# Patient Record
Sex: Female | Born: 1970 | Race: Black or African American | Hispanic: No | Smoking: Never smoker
Health system: Southern US, Community
[De-identification: ages and names within clinical notes are randomized; demographics above are authoritative.]

## PROBLEM LIST (undated history)

## (undated) DIAGNOSIS — Z5189 Encounter for other specified aftercare: Secondary | ICD-10-CM

## (undated) DIAGNOSIS — A6000 Herpesviral infection of urogenital system, unspecified: Secondary | ICD-10-CM

## (undated) DIAGNOSIS — D219 Benign neoplasm of connective and other soft tissue, unspecified: Secondary | ICD-10-CM

## (undated) HISTORY — DX: Benign neoplasm of connective and other soft tissue, unspecified: D21.9

## (undated) HISTORY — PX: MYOMECTOMY: SHX85

## (undated) HISTORY — DX: Encounter for other specified aftercare: Z51.89

## (undated) HISTORY — PX: WISDOM TOOTH EXTRACTION: SHX21

## (undated) HISTORY — DX: Herpesviral infection of urogenital system, unspecified: A60.00

---

## 1999-09-16 ENCOUNTER — Other Ambulatory Visit: Admission: RE | Admit: 1999-09-16 | Discharge: 1999-09-16 | Payer: Self-pay | Admitting: Obstetrics & Gynecology

## 2017-02-09 ENCOUNTER — Other Ambulatory Visit: Payer: Self-pay

## 2017-02-09 ENCOUNTER — Ambulatory Visit: Payer: Self-pay | Admitting: Obstetrics and Gynecology

## 2017-02-09 ENCOUNTER — Encounter: Payer: Self-pay | Admitting: Obstetrics and Gynecology

## 2017-02-09 ENCOUNTER — Ambulatory Visit (INDEPENDENT_AMBULATORY_CARE_PROVIDER_SITE_OTHER): Payer: 59 | Admitting: Obstetrics and Gynecology

## 2017-02-09 VITALS — BP 138/76 | HR 76 | Ht 65.0 in | Wt 164.0 lb

## 2017-02-09 DIAGNOSIS — Z1151 Encounter for screening for human papillomavirus (HPV): Secondary | ICD-10-CM | POA: Diagnosis not present

## 2017-02-09 DIAGNOSIS — Z1239 Encounter for other screening for malignant neoplasm of breast: Secondary | ICD-10-CM

## 2017-02-09 DIAGNOSIS — Z1231 Encounter for screening mammogram for malignant neoplasm of breast: Secondary | ICD-10-CM | POA: Diagnosis not present

## 2017-02-09 DIAGNOSIS — Z13220 Encounter for screening for lipoid disorders: Secondary | ICD-10-CM

## 2017-02-09 DIAGNOSIS — Z01419 Encounter for gynecological examination (general) (routine) without abnormal findings: Secondary | ICD-10-CM

## 2017-02-09 DIAGNOSIS — Z0000 Encounter for general adult medical examination without abnormal findings: Secondary | ICD-10-CM | POA: Diagnosis not present

## 2017-02-09 DIAGNOSIS — Z3169 Encounter for other general counseling and advice on procreation: Secondary | ICD-10-CM | POA: Diagnosis not present

## 2017-02-09 DIAGNOSIS — Z124 Encounter for screening for malignant neoplasm of cervix: Secondary | ICD-10-CM | POA: Diagnosis not present

## 2017-02-09 NOTE — Progress Notes (Signed)
PCP:  Patient, No Pcp Per   Chief Complaint  Patient presents with  . Gynecologic Exam    No complaints     HPI:      [name] is a 46 y.o. No obstetric history on file. who LMP was Patient's last menstrual period was 02/07/2017., presents today for her annual examination.  Her menses are regular every 28-30 days, lasting 4 days.  Dysmenorrhea none. She does not have intermenstrual bleeding. She is s/p 2 myomectomies due to large number of leio. She wanted to preserve fertility at the time and still wonders about pregnancy.   Sex activity: single partner, contraception - none. Conception ok.  Last Pap: elsewhere. No hx of abn paps in past.  Hx of STDs: HSV type 2. No recent outbreaks.  Last mammogram: not recently. There is no FH of breast cancer. There is no FH of ovarian cancer. The patient does not do self-breast exams.  Tobacco use: The patient denies current or previous tobacco use. Alcohol use: social drinker No drug use.  Exercise: moderately active  She does get adequate calcium but not Vitamin D in her diet.  Not had recent labs.  Past Medical History:  Diagnosis Date  . Fibroids     Past Surgical History:  Procedure Laterality Date  . MYOMECTOMY  04/2003:06/2014  . WISDOM TOOTH EXTRACTION  73's    Family History  Problem Relation Age of Onset  . Hypertension Mother   . Diabetes type II Mother   . Hypertension Father   . Hypertension Maternal Grandmother   . Diabetes Mellitus II Maternal Grandmother   . Hypertension Paternal Grandmother   . Stroke Paternal Grandmother   . COPD Paternal Grandfather     Social History   Socioeconomic History  . Marital status: Married    Spouse name: Not on file  . Number of children: Not on file  . Years of education: Not on file  . Highest education level: Not on file  Social Needs  . Financial resource strain: Not on file  . Food insecurity - worry: Not on file  . Food insecurity - inability:  Not on file  . Transportation needs - medical: Not on file  . Transportation needs - non-medical: Not on file  Occupational History  . Occupation: Therapist, art  Tobacco Use  . Smoking status: Never Smoker  . Smokeless tobacco: Never Used  Substance and Sexual Activity  . Alcohol use: Yes    Alcohol/week: 0.6 oz    Types: 1 Glasses of wine per week  . Drug use: No  . Sexual activity: Yes    Partners: Male    Birth control/protection: None  Other Topics Concern  . Not on file  Social History Narrative  . Not on file    No outpatient medications have been marked as taking for the 02/09/17 encounter (Office Visit) with Copland, Deirdre Evener, PA-C.     ROS:  Review of Systems  Constitutional: Negative for fatigue, fever and unexpected weight change.  Respiratory: Negative for cough, shortness of breath and wheezing.   Cardiovascular: Negative for chest pain, palpitations and leg swelling.  Gastrointestinal: Negative for blood in stool, constipation, diarrhea, nausea and vomiting.  Endocrine: Negative for cold intolerance, heat intolerance and polyuria.  Genitourinary: Negative for dyspareunia, dysuria, flank pain, frequency, genital sores, hematuria, menstrual problem, pelvic pain, urgency, vaginal bleeding, vaginal discharge and vaginal pain.  Musculoskeletal: Negative for back pain, joint swelling and myalgias.  Skin: Negative  for rash.  Neurological: Negative for dizziness, syncope, light-headedness, numbness and headaches.  Hematological: Negative for adenopathy.  Psychiatric/Behavioral: Negative for agitation, confusion, sleep disturbance and suicidal ideas. The patient is not nervous/anxious.      Objective: BP 138/76 (BP Location: Left Arm, Patient Position: Sitting, Cuff Size: Normal)   Pulse 76   Ht '5\' 5"'  (1.651 m)   Wt 164 lb (74.4 kg)   LMP 02/07/2017   BMI 27.29 kg/m    Physical Exam  Constitutional: She is oriented to person, place, and time. She appears  well-developed and well-nourished.  Genitourinary: Vagina normal and uterus normal. There is no rash or tenderness on the right labia. There is no rash or tenderness on the left labia. No erythema or tenderness in the vagina. No vaginal discharge found. Right adnexum does not display mass and does not display tenderness. Left adnexum does not display mass and does not display tenderness. Cervix does not exhibit motion tenderness or polyp. Uterus is not enlarged or tender.  Neck: Normal range of motion. No thyromegaly present.  Cardiovascular: Normal rate, regular rhythm and normal heart sounds.  No murmur heard. Pulmonary/Chest: Effort normal and breath sounds normal. Right breast exhibits no mass, no nipple discharge, no skin change and no tenderness. Left breast exhibits no mass, no nipple discharge, no skin change and no tenderness.  Abdominal: Soft. There is no tenderness. There is no guarding.  Musculoskeletal: Normal range of motion.  Neurological: She is alert and oriented to person, place, and time. No cranial nerve deficit.  Psychiatric: She has a normal mood and affect. Her behavior is normal.  Vitals reviewed.   Assessment/Plan: Encounter for annual routine gynecological examination  Cervical cancer screening - Plan: IGP, Aptima HPV  Screening for HPV (human papillomavirus) - Plan: IGP, Aptima HPV  Screening for breast cancer - Pt to sched mammo. - Plan: MM DIGITAL SCREENING BILATERAL  Infertility counseling - Question ability to conceive d/t age. Pt to try urine ovulation pred kit. Could also do [facility]H labs/semen analysis. Pt to consider. Also question status of uterus  Blood tests for routine general physical examination - Plan: Comprehensive metabolic panel, Lipid panel  Screening cholesterol level - Plan: Lipid panel           GYN counsel breast self exam, mammography screening, adequate intake of calcium and vitamin D, diet and exercise     F/U  Return in about 1 year  (around 02/09/2018).  Alicia B. Copland, PA-C 02/09/2017 12:06 PM

## 2017-02-09 NOTE — Patient Instructions (Signed)
I value your feedback and entrusting us with your care. If you get a [facility] patient survey, I would appreciate you taking the time to let us know about your experience today. Thank you! 

## 2017-02-11 LAB — IGP, APTIMA HPV
HPV APTIMA: NEGATIVE
PAP SMEAR COMMENT: 0

## 2017-02-17 NOTE — Addendum Note (Signed)
Addended by: Leonor Liv R on: 02/17/2017 12:05 PM   Modules accepted: Orders

## 2017-02-19 ENCOUNTER — Telehealth: Payer: Self-pay | Admitting: Obstetrics and Gynecology

## 2017-02-19 DIAGNOSIS — R7309 Other abnormal glucose: Secondary | ICD-10-CM

## 2017-02-19 LAB — COMPREHENSIVE METABOLIC PANEL
ALBUMIN: 4.5 g/dL (ref 3.5–5.5)
ALT: 26 IU/L (ref 0–32)
AST: 33 IU/L (ref 0–40)
Albumin/Globulin Ratio: 2.3 — ABNORMAL HIGH (ref 1.2–2.2)
Alkaline Phosphatase: 49 IU/L (ref 39–117)
BUN/Creatinine Ratio: 13 (ref 9–23)
BUN: 11 mg/dL (ref 6–24)
Bilirubin Total: 0.3 mg/dL (ref 0.0–1.2)
CALCIUM: 9.7 mg/dL (ref 8.7–10.2)
CO2: 23 mmol/L (ref 20–29)
CREATININE: 0.85 mg/dL (ref 0.57–1.00)
Chloride: 101 mmol/L (ref 96–106)
GFR calc Af Amer: 95 mL/min/{1.73_m2} (ref >59)
GFR, EST NON AFRICAN AMERICAN: 82 mL/min/{1.73_m2} (ref >59)
GLOBULIN, TOTAL: 2 g/dL (ref 1.5–4.5)
Glucose: 125 mg/dL — ABNORMAL HIGH (ref 65–99)
Potassium: 4.8 mmol/L (ref 3.5–5.2)
SODIUM: 140 mmol/L (ref 134–144)
Total Protein: 6.5 g/dL (ref 6.0–8.5)

## 2017-02-19 LAB — LIPID PANEL
CHOL/HDL RATIO: 3.6 ratio (ref 0.0–4.4)
Cholesterol, Total: 159 mg/dL (ref 100–199)
HDL: 44 mg/dL (ref >39)
LDL CALC: 87 mg/dL (ref 0–99)
TRIGLYCERIDES: 140 mg/dL (ref 0–149)
VLDL Cholesterol Cal: 28 mg/dL (ref 5–40)

## 2017-02-19 NOTE — Telephone Encounter (Signed)
Pt aware of labs. Need HgA1C. Will f/u with results.

## 2017-02-27 ENCOUNTER — Other Ambulatory Visit: Payer: Self-pay | Admitting: Obstetrics and Gynecology

## 2017-02-28 LAB — HEMOGLOBIN A1C
ESTIMATED AVERAGE GLUCOSE: 131 mg/dL
HEMOGLOBIN A1C: 6.2 % — AB (ref 4.8–5.6)

## 2017-03-03 ENCOUNTER — Telehealth: Payer: Self-pay | Admitting: Obstetrics and Gynecology

## 2017-03-03 DIAGNOSIS — R7303 Prediabetes: Secondary | ICD-10-CM | POA: Insufficient documentation

## 2017-03-03 NOTE — Telephone Encounter (Signed)
Pt aware of pre-DM on labs with HgA1C of 6.2%. Pt started exercise/diet changes. Repeat labs in 6 months. Will f/u with results.

## 2017-07-02 ENCOUNTER — Ambulatory Visit
Admission: RE | Admit: 2017-07-02 | Discharge: 2017-07-02 | Disposition: A | Discharge status: Home / Self Care | Payer: 59 | Source: Ambulatory Visit | Attending: Obstetrics and Gynecology | Admitting: Obstetrics and Gynecology

## 2017-07-02 DIAGNOSIS — Z1231 Encounter for screening mammogram for malignant neoplasm of breast: Secondary | ICD-10-CM | POA: Insufficient documentation

## 2017-07-02 DIAGNOSIS — N6489 Other specified disorders of breast: Secondary | ICD-10-CM | POA: Diagnosis not present

## 2017-07-02 DIAGNOSIS — R928 Other abnormal and inconclusive findings on diagnostic imaging of breast: Secondary | ICD-10-CM | POA: Diagnosis not present

## 2017-07-02 DIAGNOSIS — Z1239 Encounter for other screening for malignant neoplasm of breast: Secondary | ICD-10-CM

## 2017-07-27 ENCOUNTER — Other Ambulatory Visit: Payer: Self-pay | Admitting: Obstetrics and Gynecology

## 2017-07-27 DIAGNOSIS — R928 Other abnormal and inconclusive findings on diagnostic imaging of breast: Secondary | ICD-10-CM

## 2017-07-27 DIAGNOSIS — N6489 Other specified disorders of breast: Secondary | ICD-10-CM

## 2017-08-07 ENCOUNTER — Ambulatory Visit
Admission: RE | Admit: 2017-08-07 | Discharge: 2017-08-07 | Disposition: A | Discharge status: Home / Self Care | Payer: 59 | Source: Ambulatory Visit | Attending: Obstetrics and Gynecology | Admitting: Obstetrics and Gynecology

## 2017-08-07 DIAGNOSIS — N6489 Other specified disorders of breast: Secondary | ICD-10-CM

## 2017-08-07 DIAGNOSIS — R928 Other abnormal and inconclusive findings on diagnostic imaging of breast: Secondary | ICD-10-CM

## 2017-08-09 ENCOUNTER — Encounter: Payer: Self-pay | Admitting: Obstetrics and Gynecology

## 2018-02-22 ENCOUNTER — Ambulatory Visit: Payer: 59 | Admitting: Obstetrics and Gynecology

## 2018-04-28 ENCOUNTER — Encounter: Payer: Self-pay | Admitting: Family Medicine

## 2018-04-28 ENCOUNTER — Other Ambulatory Visit (HOSPITAL_COMMUNITY)
Admission: RE | Admit: 2018-04-28 | Discharge: 2018-04-28 | Disposition: A | Discharge status: Home / Self Care | Payer: BLUE CROSS/BLUE SHIELD | Source: Ambulatory Visit | Attending: Family Medicine | Admitting: Family Medicine

## 2018-04-28 ENCOUNTER — Ambulatory Visit: Payer: BLUE CROSS/BLUE SHIELD | Admitting: Family Medicine

## 2018-04-28 VITALS — BP 130/78 | HR 75 | Temp 98.9°F | Resp 16 | Ht 64.0 in | Wt 147.1 lb

## 2018-04-28 DIAGNOSIS — Z113 Encounter for screening for infections with a predominantly sexual mode of transmission: Secondary | ICD-10-CM | POA: Insufficient documentation

## 2018-04-28 DIAGNOSIS — Z130 Encounter for screening for diseases of the blood and blood-forming organs and certain disorders involving the immune mechanism: Secondary | ICD-10-CM

## 2018-04-28 DIAGNOSIS — R739 Hyperglycemia, unspecified: Secondary | ICD-10-CM | POA: Diagnosis not present

## 2018-04-28 DIAGNOSIS — Z13220 Encounter for screening for lipoid disorders: Secondary | ICD-10-CM | POA: Diagnosis not present

## 2018-04-28 DIAGNOSIS — Z23 Encounter for immunization: Secondary | ICD-10-CM | POA: Diagnosis not present

## 2018-04-28 DIAGNOSIS — Z86018 Personal history of other benign neoplasm: Secondary | ICD-10-CM

## 2018-04-28 DIAGNOSIS — A6000 Herpesviral infection of urogenital system, unspecified: Secondary | ICD-10-CM | POA: Insufficient documentation

## 2018-04-28 DIAGNOSIS — A6004 Herpesviral vulvovaginitis: Secondary | ICD-10-CM

## 2018-04-28 DIAGNOSIS — E559 Vitamin D deficiency, unspecified: Secondary | ICD-10-CM

## 2018-04-28 MED ORDER — VALACYCLOVIR HCL 500 MG PO TABS: 500.0000 mg | ORAL_TABLET | Freq: Two times a day (BID) | ORAL | 3 refills | Status: DC

## 2018-04-28 NOTE — Progress Notes (Signed)
Name: Alexis Golden   MRN: [id number]    DOB: 1970/11/05   Date:04/28/2018       Progress Note  Subjective  Chief Complaint  Chief Complaint  Patient presents with  . Establish Care    HPI  Genital herpes: she states no outbreaks in the past 10 years. She has been divorced since 03/2018, no sexual partners since separation. Discussed prevention when she starts to date again.   Pre-diabetes: last lab work done at [facility]: hgbA1C was 6.2 % and she changed her diet and joined a gym, she was working out 4 times a week. She just now moved to [place] and needs to find another gym there. She has lost almost 20 lbs in the past 13 months. We will recheck labs.   History of uterine fibroids: had surgery in the past .    Patient Active Problem List   Diagnosis Date Noted  . History of uterine fibroid 04/28/2018  . Genital herpes 04/28/2018  . Pre-diabetes 03/03/2017    Past Surgical History:  Procedure Laterality Date  . MYOMECTOMY  04/2003:06/2012  . WISDOM TOOTH EXTRACTION  77's    Family History  Problem Relation Age of Onset  . Hypertension Mother   . Diabetes type II Mother   . Hypertension Father   . Hypertension Maternal Grandmother   . Diabetes Mellitus II Maternal Grandmother   . Hypertension Paternal Grandmother   . Stroke Paternal Grandmother   . COPD Paternal Grandfather   . Hypertension Maternal Aunt   . COPD Paternal 81   . Breast cancer Neg Hx     Social History   Socioeconomic History  . Marital status: Divorced    Spouse name: Not on file  . Number of children: 0  . Years of education: Not on file  . Highest education level: Bachelor's degree (e.g., BA, AB, BS)  Occupational History  . Occupation: Therapist, art  Social Needs  . Financial resource strain: Not hard at all  . Food insecurity:    Worry: Never true    Inability: Never true  . Transportation needs:    Medical: No    Non-medical: No  Tobacco Use  . Smoking status: Never  Smoker  . Smokeless tobacco: Never Used  Substance and Sexual Activity  . Alcohol use: Yes    Alcohol/week: 1.0 standard drinks    Types: 1 Glasses of wine per week  . Drug use: No  . Sexual activity: Not Currently    Partners: Male    Birth control/protection: None  Lifestyle  . Physical activity:    Days per week: 0 days    Minutes per session: 0 min  . Stress: Not at all  Relationships  . Social connections:    Talks on phone: More than three times a week    Gets together: More than three times a week    Attends religious service: Never    Active member of club or organization: No    Attends meetings of clubs or organizations: Never    Relationship status: Divorced  . Intimate partner violence:    Fear of current or ex partner: No    Emotionally abused: No    Physically abused: No    Forced sexual activity: No  Other Topics Concern  . Not on file  Social History Narrative   She was married fir 6 years,  divorced since 03/2017    Tried to have children but unable.  Current Outpatient Medications:  Marland Kitchen  Multiple Vitamins-Minerals (HAIR SKIN AND NAILS FORMULA PO), Take 1 tablet by mouth daily., Disp: , Rfl:  .  valACYclovir (VALTREX) 500 MG tablet, Take 1 tablet (500 mg total) by mouth 2 (two) times daily., Disp: 90 tablet, Rfl: 3  Allergies  Allergen Reactions  . Influenza Vaccines Rash  . Percocet [Oxycodone-Acetaminophen] Other (See Comments)    Sensitivity    I personally reviewed active problem list, medication list, allergies, family history, social history with the patient/caregiver today.   ROS  Constitutional: Negative for fever, positive for  weight change - loss but because with life style modification .  Respiratory: Negative for cough and shortness of breath.   Cardiovascular: Negative for chest pain or palpitations.  Gastrointestinal: Negative for abdominal pain, no bowel changes.  Musculoskeletal: Negative for gait problem or joint swelling.   Skin: Negative for rash.  Neurological: Negative for dizziness or headache.  No other specific complaints in a complete review of systems (except as listed in HPI above).  Objective  Vitals:   04/28/18 1405  BP: 130/78  Pulse: 75  Resp: 16  Temp: 98.9 F (37.2 C)  TempSrc: Oral  SpO2: 98%  Weight: 147 lb 1.6 oz (66.7 kg)  Height: 5\' 4"  (1.626 m)    Body mass index is 25.25 kg/m.  Physical Exam  Constitutional: Patient appears well-developed and well-nourished. No distress.  HEENT: head atraumatic, normocephalic, pupils equal and reactive to light, neck supple, throat within normal limits Cardiovascular: Normal rate, regular rhythm and normal heart sounds.  No murmur heard. No BLE edema. Pulmonary/Chest: Effort normal and breath sounds normal. No respiratory distress. Abdominal: Soft.  There is no tenderness. Psychiatric: Patient has a normal mood and affect. behavior is normal. Judgment and thought content normal.  PHQ2/9: Depression screen PHQ 2/9 04/28/2018  Decreased Interest 0  Down, Depressed, Hopeless 0  PHQ - 2 Score 0  Altered sleeping 0  Tired, decreased energy 0  Change in appetite 0  Feeling bad or failure about yourself  0  Trouble concentrating 0  Moving slowly or fidgety/restless 0  Suicidal thoughts 0  PHQ-9 Score 0  Difficult doing work/chores Not difficult at all     Fall Risk: Fall Risk  04/28/2018  Falls in the past year? 0     Functional Status Survey: Is the patient deaf or have difficulty hearing?: No Does the patient have difficulty seeing, even when wearing glasses/contacts?: No Does the patient have difficulty concentrating, remembering, or making decisions?: No Does the patient have difficulty walking or climbing stairs?: No Does the patient have difficulty dressing or bathing?: No Does the patient have difficulty doing errands alone such as visiting a doctor's office or shopping?: No    Assessment & Plan  1. History of uterine  fibroid  - CBC with Differential/Platelet  2. Lipid screening  - Lipid panel  3. Hyperglycemia  - Hemoglobin A1c - COMPLETE METABOLIC PANEL WITH GFR  4. Herpes simplex vulvovaginitis  - valACYclovir (VALTREX) 500 MG tablet; Take 1 tablet (500 mg total) by mouth 2 (two) times daily.  Dispense: 90 tablet; Refill: 3  5. Routine screening for STI (sexually transmitted infection)  - Hepatitis panel, acute - RPR - GC Probe amplification, urine - HIV antibody (with reflex)  6. Vitamin D deficiency  - VITAMIN D 25 Hydroxy (Vit-D Deficiency, Fractures)  7. Screening, iron deficiency anemia  - CBC with Differential/Platelet  8. Need for Tdap vaccination  - Tdap vaccine greater  than or equal to 7yo IM

## 2018-04-28 NOTE — Addendum Note (Signed)
Addended by: Steele Sizer F on: 04/28/2018 04:56 PM   Modules accepted: Level of Service

## 2018-04-29 LAB — CBC WITH DIFFERENTIAL/PLATELET
Absolute Monocytes: 376 cells/uL (ref 200–950)
Basophils Absolute: 20 cells/uL (ref 0–200)
Basophils Relative: 0.3 %
Eosinophils Absolute: 40 cells/uL (ref 15–500)
Eosinophils Relative: 0.6 %
HCT: 41.2 % (ref 35.0–45.0)
Hemoglobin: 13.6 g/dL (ref 11.7–15.5)
Lymphs Abs: 2356 cells/uL (ref 850–3900)
MCH: 28.9 pg (ref 27.0–33.0)
MCHC: 33 g/dL (ref 32.0–36.0)
MCV: 87.7 fL (ref 80.0–100.0)
MPV: 12.3 fL (ref 7.5–12.5)
Monocytes Relative: 5.7 %
Neutro Abs: 3808 cells/uL (ref 1500–7800)
Neutrophils Relative %: 57.7 %
Platelets: 209 10*3/uL (ref 140–400)
RBC: 4.7 10*6/uL (ref 3.80–5.10)
RDW: 11.9 % (ref 11.0–15.0)
TOTAL LYMPHOCYTE: 35.7 %
WBC: 6.6 10*3/uL (ref 3.8–10.8)

## 2018-04-29 LAB — URINE CYTOLOGY ANCILLARY ONLY
Chlamydia: NEGATIVE
NEISSERIA GONORRHEA: NEGATIVE

## 2018-04-29 LAB — COMPLETE METABOLIC PANEL WITH GFR
AG Ratio: 1.5 (calc) (ref 1.0–2.5)
ALT: 15 U/L (ref 6–29)
AST: 18 U/L (ref 10–35)
Albumin: 4.5 g/dL (ref 3.6–5.1)
Alkaline phosphatase (APISO): 40 U/L (ref 31–125)
BUN: 8 mg/dL (ref 7–25)
CO2: 28 mmol/L (ref 20–32)
Calcium: 9.7 mg/dL (ref 8.6–10.2)
Chloride: 104 mmol/L (ref 98–110)
Creat: 0.86 mg/dL (ref 0.50–1.10)
GFR, Est African American: 93 mL/min/{1.73_m2} (ref >=60)
GFR, Est Non African American: 80 mL/min/{1.73_m2} (ref >=60)
Globulin: 3 g/dL (calc) (ref 1.9–3.7)
Glucose, Bld: 86 mg/dL (ref 65–99)
Potassium: 4.2 mmol/L (ref 3.5–5.3)
Sodium: 142 mmol/L (ref 135–146)
Total Bilirubin: 0.5 mg/dL (ref 0.2–1.2)
Total Protein: 7.5 g/dL (ref 6.1–8.1)

## 2018-04-29 LAB — VITAMIN D 25 HYDROXY (VIT D DEFICIENCY, FRACTURES): Vit D, 25-Hydroxy: 11 ng/mL — ABNORMAL LOW (ref 30–100)

## 2018-04-29 LAB — HEMOGLOBIN A1C
Hgb A1c MFr Bld: 5.5 % of total Hgb (ref <5.7)
Mean Plasma Glucose: 111 (calc)
eAG (mmol/L): 6.2 (calc)

## 2018-04-29 LAB — LIPID PANEL
CHOLESTEROL: 151 mg/dL (ref <200)
HDL: 60 mg/dL (ref >=50)
LDL Cholesterol (Calc): 73 mg/dL (calc)
Non-HDL Cholesterol (Calc): 91 mg/dL (calc) (ref <130)
Total CHOL/HDL Ratio: 2.5 (calc) (ref <5.0)
Triglycerides: 94 mg/dL (ref <150)

## 2018-04-29 LAB — HEPATITIS PANEL, ACUTE
HEP B S AG: NONREACTIVE
HEP C AB: NONREACTIVE
Hep A IgM: NONREACTIVE
Hep B C IgM: NONREACTIVE
SIGNAL TO CUT-OFF: 0.01 (ref <1.00)

## 2018-04-29 LAB — RPR: RPR Ser Ql: NONREACTIVE

## 2018-04-29 LAB — HIV ANTIBODY (ROUTINE TESTING W REFLEX): HIV 1&2 Ab, 4th Generation: NONREACTIVE

## 2018-05-03 ENCOUNTER — Other Ambulatory Visit: Payer: Self-pay | Admitting: Family Medicine

## 2018-05-03 MED ORDER — VITAMIN D (ERGOCALCIFEROL) 1.25 MG (50000 UNIT) PO CAPS: 50000.0000 [IU] | ORAL_CAPSULE | ORAL | 0 refills | Status: DC

## 2019-06-11 ENCOUNTER — Ambulatory Visit: Payer: Self-pay | Attending: Internal Medicine

## 2019-06-11 DIAGNOSIS — Z23 Encounter for immunization: Secondary | ICD-10-CM

## 2019-06-11 NOTE — Progress Notes (Signed)
   Covid-19 Vaccination Clinic  Name:  Alexis Golden    MRN: [id number] DOB: [birth year]  06/11/2019  Alexis Golden was observed post Covid-19 immunization for 15 minutes without incident. She was provided with Vaccine Information Sheet and instruction to access the V-Safe system.   Alexis Golden was instructed to call 911 with any severe reactions post vaccine: Marland Kitchen Difficulty breathing  . Swelling of face and throat  . A fast heartbeat  . A bad rash all over body  . Dizziness and weakness   Immunizations Administered    Name Date Dose VIS Date Route   Pfizer COVID-19 Vaccine 06/11/2019  9:33 AM 0.3 mL 03/04/2019 Intramuscular   Manufacturer: [place]   Lot: [id number]   [place]: [id number]

## 2019-07-02 ENCOUNTER — Ambulatory Visit: Payer: Self-pay | Attending: Internal Medicine

## 2019-07-02 DIAGNOSIS — Z23 Encounter for immunization: Secondary | ICD-10-CM

## 2019-07-02 NOTE — Progress Notes (Signed)
   Covid-19 Vaccination Clinic  Name:  Alexis Golden    MRN: [id number] DOB: 1970/11/30  07/02/2019  Alexis Golden was observed post Covid-19 immunization for 15 minutes without incident. She was provided with Vaccine Information Sheet and instruction to access the V-Safe system.   Alexis Golden was instructed to call 911 with any severe reactions post vaccine: Marland Kitchen Difficulty breathing  . Swelling of face and throat  . A fast heartbeat  . A bad rash all over body  . Dizziness and weakness   Immunizations Administered    Name Date Dose VIS Date Route   Pfizer COVID-19 Vaccine 07/02/2019  9:07 AM 0.3 mL 03/04/2019 Intramuscular   Manufacturer: [place]   Lot: [id number]   [place]: [id number]

## 2019-08-23 ENCOUNTER — Other Ambulatory Visit: Payer: Self-pay | Admitting: Family Medicine

## 2019-08-23 DIAGNOSIS — Z1231 Encounter for screening mammogram for malignant neoplasm of breast: Secondary | ICD-10-CM

## 2019-09-02 ENCOUNTER — Ambulatory Visit
Admission: RE | Admit: 2019-09-02 | Discharge: 2019-09-02 | Disposition: A | Discharge status: Home / Self Care | Payer: 59 | Source: Ambulatory Visit

## 2019-09-02 ENCOUNTER — Other Ambulatory Visit: Payer: Self-pay

## 2019-09-02 DIAGNOSIS — Z1231 Encounter for screening mammogram for malignant neoplasm of breast: Secondary | ICD-10-CM

## 2019-09-07 ENCOUNTER — Other Ambulatory Visit: Payer: Self-pay | Admitting: Family Medicine

## 2019-09-07 DIAGNOSIS — R928 Other abnormal and inconclusive findings on diagnostic imaging of breast: Secondary | ICD-10-CM

## 2019-09-15 ENCOUNTER — Ambulatory Visit
Admission: RE | Admit: 2019-09-15 | Discharge: 2019-09-15 | Disposition: A | Discharge status: Home / Self Care | Payer: 59 | Source: Ambulatory Visit | Attending: Family Medicine | Admitting: Family Medicine

## 2019-09-15 ENCOUNTER — Other Ambulatory Visit: Payer: Self-pay

## 2019-09-15 ENCOUNTER — Other Ambulatory Visit: Payer: Self-pay | Admitting: Family Medicine

## 2019-09-15 DIAGNOSIS — R928 Other abnormal and inconclusive findings on diagnostic imaging of breast: Secondary | ICD-10-CM

## 2019-09-23 ENCOUNTER — Other Ambulatory Visit (HOSPITAL_COMMUNITY)
Admission: RE | Admit: 2019-09-23 | Discharge: 2019-09-23 | Disposition: A | Discharge status: Home / Self Care | Payer: 59 | Source: Ambulatory Visit | Attending: Family Medicine | Admitting: Family Medicine

## 2019-09-23 ENCOUNTER — Encounter: Payer: Self-pay | Admitting: Family Medicine

## 2019-09-23 ENCOUNTER — Ambulatory Visit (INDEPENDENT_AMBULATORY_CARE_PROVIDER_SITE_OTHER): Payer: 59 | Admitting: Family Medicine

## 2019-09-23 ENCOUNTER — Other Ambulatory Visit: Payer: Self-pay

## 2019-09-23 VITALS — BP 124/84 | HR 99 | Temp 97.3°F | Resp 16 | Ht 64.0 in | Wt 157.9 lb

## 2019-09-23 DIAGNOSIS — Z124 Encounter for screening for malignant neoplasm of cervix: Secondary | ICD-10-CM | POA: Diagnosis present

## 2019-09-23 DIAGNOSIS — E559 Vitamin D deficiency, unspecified: Secondary | ICD-10-CM

## 2019-09-23 DIAGNOSIS — Z130 Encounter for screening for diseases of the blood and blood-forming organs and certain disorders involving the immune mechanism: Secondary | ICD-10-CM

## 2019-09-23 DIAGNOSIS — Z0000 Encounter for general adult medical examination without abnormal findings: Secondary | ICD-10-CM | POA: Diagnosis not present

## 2019-09-23 DIAGNOSIS — Z1211 Encounter for screening for malignant neoplasm of colon: Secondary | ICD-10-CM

## 2019-09-23 DIAGNOSIS — Z86018 Personal history of other benign neoplasm: Secondary | ICD-10-CM

## 2019-09-23 DIAGNOSIS — R739 Hyperglycemia, unspecified: Secondary | ICD-10-CM

## 2019-09-23 DIAGNOSIS — Z13220 Encounter for screening for lipoid disorders: Secondary | ICD-10-CM | POA: Diagnosis not present

## 2019-09-23 DIAGNOSIS — A6004 Herpesviral vulvovaginitis: Secondary | ICD-10-CM

## 2019-09-23 MED ORDER — VITAMIN D (ERGOCALCIFEROL) 1.25 MG (50000 UNIT) PO CAPS: 50000.0000 [IU] | ORAL_CAPSULE | ORAL | 3 refills | Status: DC

## 2019-09-23 MED ORDER — VALACYCLOVIR HCL 500 MG PO TABS: 500.0000 mg | ORAL_TABLET | Freq: Two times a day (BID) | ORAL | 3 refills | Status: DC

## 2019-09-23 NOTE — Patient Instructions (Signed)

## 2019-09-23 NOTE — Progress Notes (Addendum)
Name: Jourden Delmont   MRN: [id number]    DOB: Feb 03, 1971   Date:09/23/2019       Progress Note  Subjective  Chief Complaint  Chief Complaint  Patient presents with  . Annual Exam    HPI  Patient presents for annual CPE.  Diet:balanced, but snacking a little more with stress  Exercise: increase to 150 minutes per week   USPSTF grade A and B recommendations    Office Visit from 09/23/2019 in [facility]  AUDIT-C Score 1     Depression: Phq 9 is  negative Depression screen [facility] 2/9 09/23/2019 04/28/2018  Decreased Interest 0 0  Down, Depressed, Hopeless 0 0  PHQ - 2 Score 0 0  Altered sleeping 0 0  Tired, decreased energy 0 0  Change in appetite 0 0  Feeling bad or failure about yourself  0 0  Trouble concentrating 0 0  Moving slowly or fidgety/restless 0 0  Suicidal thoughts 0 0  PHQ-9 Score 0 0  Difficult doing work/chores - Not difficult at all   Hypertension: BP Readings from Last 3 Encounters:  09/23/19 124/84  04/28/18 130/78  02/09/17 138/76   Obesity: Wt Readings from Last 3 Encounters:  09/23/19 157 lb 14.4 oz (71.6 kg)  04/28/18 147 lb 1.6 oz (66.7 kg)  02/09/17 164 lb (74.4 kg)   BMI Readings from Last 3 Encounters:  09/23/19 27.10 kg/m  04/28/18 25.25 kg/m  02/09/17 27.29 kg/m     Hep C Screening: 2020 STD testing and prevention (HIV/chl/gon/syphilis): not interested  Intimate partner violence: negative screen  Sexual History (Partners/Practices/Protection from [employer] hx STI/Pregnancy Plans): not sexually active  Pain during Intercourse: not currently sexually active  Menstrual History/LMP/Abnormal Bleeding:  Cycles are regular  Incontinence Symptoms: some frequency but drinking more fluids   Breast cancer:  - Last Mammogram: going back Dec 2021  - BRCA gene screening: N/A  Osteoporosis: Discussed high calcium and vitamin D supplementation, weight bearing exercises  Cervical cancer screening: discussed USPTF but she would  like to have it done again today   Skin cancer: Discussed monitoring for atypical lesions  Colorectal cancer: she is willing to have colonoscopy  Lung cancer:  Low Dose CT Chest recommended if Age 61-80 years, 30 pack-year currently smoking OR have quit w/in 15years. Patient does not qualify.   ECG: not indicated at this time  Advanced Care Planning: A voluntary discussion about advance care planning including the explanation and discussion of advance directives.  Discussed health care proxy and Living will, and the patient was able to identify a health care proxy as parents .  Patient does not have a living will at present time.   Lipids: Lab Results  Component Value Date   CHOL 151 04/28/2018   CHOL 159 02/18/2017   Lab Results  Component Value Date   HDL 60 04/28/2018   HDL 44 02/18/2017   Lab Results  Component Value Date   LDLCALC 73 04/28/2018   LDLCALC 87 02/18/2017   Lab Results  Component Value Date   TRIG 94 04/28/2018   TRIG 140 02/18/2017   Lab Results  Component Value Date   CHOLHDL 2.5 04/28/2018   CHOLHDL 3.6 02/18/2017   No results found for: LDLDIRECT  Glucose: Glucose  Date Value Ref Range Status  02/18/2017 125 (H) 65 - 99 mg/dL Final   Glucose, Bld  Date Value Ref Range Status  04/28/2018 86 65 - 99 mg/dL Final    Comment:    .  Fasting reference interval .     Patient Active Problem List   Diagnosis Date Noted  . History of uterine fibroid 04/28/2018  . Genital herpes 04/28/2018  . Pre-diabetes 03/03/2017    Past Surgical History:  Procedure Laterality Date  . MYOMECTOMY  04/2003:06/2012  . WISDOM TOOTH EXTRACTION  80's    Family History  Problem Relation Age of Onset  . Hypertension Mother   . Diabetes type II Mother   . Hypertension Father   . Hypertension Maternal Grandmother   . Diabetes Mellitus II Maternal Grandmother   . Hypertension Paternal Grandmother   . Stroke Paternal Grandmother   . COPD Paternal  Grandfather   . Hypertension Maternal Aunt   . COPD Paternal 23   . Breast cancer Neg Hx     Social History   Socioeconomic History  . Marital status: Divorced    Spouse name: Not on file  . Number of children: 0  . Years of education: Not on file  . Highest education level: Bachelor's degree (e.g., BA, AB, BS)  Occupational History  . Occupation: Therapist, art  Tobacco Use  . Smoking status: Never Smoker  . Smokeless tobacco: Never Used  Vaping Use  . Vaping Use: Never used  Substance and Sexual Activity  . Alcohol use: Yes    Alcohol/week: 1.0 standard drink    Types: 1 Glasses of wine per week  . Drug use: No  . Sexual activity: Not Currently    Partners: Male    Birth control/protection: None  Other Topics Concern  . Not on file  Social History Narrative   She was married for 6 years,  divorced since 03/2017    Tried to have children but unable.    Social Determinants of Health   Financial Resource Strain: Low Risk   . Difficulty of Paying Living Expenses: Not hard at all  Food Insecurity: No Food Insecurity  . Worried About Charity fundraiser in the Last Year: Never true  . Ran Out of Food in the Last Year: Never true  Transportation Needs: No Transportation Needs  . Lack of Transportation (Medical): No  . Lack of Transportation (Non-Medical): No  Physical Activity: Insufficiently Active  . Days of Exercise per Week: 3 days  . Minutes of Exercise per Session: 40 min  Stress: No Stress Concern Present  . Feeling of Stress : Not at all  Social Connections: Moderately Integrated  . Frequency of Communication with Friends and Family: More than three times a week  . Frequency of Social Gatherings with Friends and Family: More than three times a week  . Attends Religious Services: More than 4 times per year  . Active Member of Clubs or Organizations: Yes  . Attends Archivist Meetings: More than 4 times per year  . Marital Status: Divorced   Human resources officer Violence: Not At Risk  . Fear of Current or Ex-Partner: No  . Emotionally Abused: No  . Physically Abused: No  . Sexually Abused: No     Current Outpatient Medications:  .  Ascorbic Acid (VITAMIN C) 1000 MG tablet, Take 1,000 mg by mouth daily., Disp: , Rfl:  .  ELDERBERRY PO, Take by mouth., Disp: , Rfl:  .  Multiple Vitamins-Minerals (HAIR SKIN AND NAILS FORMULA PO), Take 1 tablet by mouth daily., Disp: , Rfl:  .  valACYclovir (VALTREX) 500 MG tablet, Take 1 tablet (500 mg total) by mouth 2 (two) times daily., Disp: 90 tablet, Rfl: 3 .  Vitamin D, Ergocalciferol, (DRISDOL) 1.25 MG (50000 UT) CAPS capsule, Take 1 capsule (50,000 Units total) by mouth every 7 (seven) days., Disp: 12 capsule, Rfl: 0  Allergies  Allergen Reactions  . Influenza Vaccines Rash  . Percocet [Oxycodone-Acetaminophen] Other (See Comments)    Sensitivity     ROS  Constitutional: Negative for fever , positive for  weight change.  Respiratory: Negative for cough and shortness of breath.   Cardiovascular: Negative for chest pain or palpitations.  Gastrointestinal: Negative for abdominal pain, no bowel changes.  Musculoskeletal: Negative for gait problem or joint swelling.  Skin: Negative for rash.  Neurological: Negative for dizziness or headache.  No other specific complaints in a complete review of systems (except as listed in HPI above).  Objective  Vitals:   09/23/19 1401  BP: 124/84  Pulse: 99  Resp: 16  Temp: (!) 97.3 F (36.3 C)  TempSrc: Temporal  SpO2: 99%  Weight: 157 lb 14.4 oz (71.6 kg)  Height: 5' 4" (1.626 m)    Body mass index is 27.1 kg/m.  Physical Exam  Constitutional: Patient appears well-developed and well-nourished. No distress.  HENT: Head: Normocephalic and atraumatic. Ears: B TMs ok, no erythema or effusion; Nose: Not done. Mouth/Throat: not done Eyes: Conjunctivae and EOM are normal. Pupils are equal, round, and reactive to light. No scleral  icterus.  Neck: Normal range of motion. Neck supple. No JVD present. No thyromegaly present.  Cardiovascular: Normal rate, regular rhythm and normal heart sounds.  No murmur heard. No BLE edema. Pulmonary/Chest: Effort normal and breath sounds normal. No respiratory distress. Abdominal: Soft. Bowel sounds are normal, no distension. There is no tenderness. no masses Breast: no lumps or masses, no nipple discharge or rashes FEMALE GENITALIA:  External genitalia normal External urethra normal Vaginal vault normal without discharge or lesions Cervix deviated to the right side, friable  Bimanual exam normal without masses RECTAL: not done Musculoskeletal: Normal range of motion, no joint effusions. No gross deformities Neurological: he is alert and oriented to person, place, and time. No cranial nerve deficit. Coordination, balance, strength, speech and gait are normal.  Skin: Skin is warm and dry. No rash noted. No erythema.  Psychiatric: Patient has a normal mood and affect. behavior is normal. Judgment and thought content normal.   Fall Risk: Fall Risk  09/23/2019 04/28/2018  Falls in the past year? 0 0  Number falls in past yr: 0 -  Injury with Fall? 0 -     Functional Status Survey: Is the patient deaf or have difficulty hearing?: No Does the patient have difficulty seeing, even when wearing glasses/contacts?: No Does the patient have difficulty concentrating, remembering, or making decisions?: No Does the patient have difficulty walking or climbing stairs?: No Does the patient have difficulty dressing or bathing?: No Does the patient have difficulty doing errands alone such as visiting a doctor's office or shopping?: No   Assessment & Plan  1. Well adult exam  - Lipid panel - VITAMIN D 25 Hydroxy (Vit-D Deficiency, Fractures) - CBC with Differential/Platelet - COMPLETE METABOLIC PANEL WITH GFR - Hemoglobin A1c  2. Lipid screening  - Lipid panel  3. Hyperglycemia  -  Hemoglobin A1c  4. Vitamin D deficiency  - VITAMIN D 25 Hydroxy (Vit-D Deficiency, Fractures)  5. Screening, iron deficiency anemia  - CBC with Differential/Platelet  6. Herpes simplex vulvovaginitis  Medication refilled, no outbreaks in years   7. History of uterine fibroid  - CBC with Differential/Platelet  -USPSTF grade  A and B recommendations reviewed with patient; age-appropriate recommendations, preventive care, screening tests, etc discussed and encouraged; healthy living encouraged; see AVS for patient education given to patient -Discussed importance of 150 minutes of physical activity weekly, eat two servings of fish weekly, eat one serving of tree nuts ( cashews, pistachios, pecans, almonds.Marland Kitchen) every other day, eat 6 servings of fruit/vegetables daily and drink plenty of water and avoid sweet beverages.

## 2019-09-24 LAB — COMPLETE METABOLIC PANEL WITH GFR
AG Ratio: 1.8 (calc) (ref 1.0–2.5)
ALT: 12 U/L (ref 6–29)
AST: 13 U/L (ref 10–35)
Albumin: 4.4 g/dL (ref 3.6–5.1)
Alkaline phosphatase (APISO): 41 U/L (ref 31–125)
BUN: 11 mg/dL (ref 7–25)
CO2: 31 mmol/L (ref 20–32)
Calcium: 10.1 mg/dL (ref 8.6–10.2)
Chloride: 104 mmol/L (ref 98–110)
Creat: 0.8 mg/dL (ref 0.50–1.10)
GFR, Est African American: 100 mL/min/{1.73_m2} (ref >=60)
GFR, Est Non African American: 87 mL/min/{1.73_m2} (ref >=60)
Globulin: 2.5 g/dL (calc) (ref 1.9–3.7)
Glucose, Bld: 72 mg/dL (ref 65–99)
Potassium: 4.4 mmol/L (ref 3.5–5.3)
Sodium: 140 mmol/L (ref 135–146)
Total Bilirubin: 0.3 mg/dL (ref 0.2–1.2)
Total Protein: 6.9 g/dL (ref 6.1–8.1)

## 2019-09-24 LAB — HEMOGLOBIN A1C
Hgb A1c MFr Bld: 5.6 % of total Hgb (ref <5.7)
Mean Plasma Glucose: 114 (calc)
eAG (mmol/L): 6.3 (calc)

## 2019-09-24 LAB — CBC WITH DIFFERENTIAL/PLATELET
Absolute Monocytes: 539 cells/uL (ref 200–950)
Basophils Absolute: 28 cells/uL (ref 0–200)
Basophils Relative: 0.4 %
Eosinophils Absolute: 63 cells/uL (ref 15–500)
Eosinophils Relative: 0.9 %
HCT: 38.9 % (ref 35.0–45.0)
Hemoglobin: 12.8 g/dL (ref 11.7–15.5)
Lymphs Abs: 3073 cells/uL (ref 850–3900)
MCH: 28.9 pg (ref 27.0–33.0)
MCHC: 32.9 g/dL (ref 32.0–36.0)
MCV: 87.8 fL (ref 80.0–100.0)
MPV: 12.3 fL (ref 7.5–12.5)
Monocytes Relative: 7.7 %
Neutro Abs: 3297 cells/uL (ref 1500–7800)
Neutrophils Relative %: 47.1 %
Platelets: 210 10*3/uL (ref 140–400)
RBC: 4.43 10*6/uL (ref 3.80–5.10)
RDW: 12.3 % (ref 11.0–15.0)
Total Lymphocyte: 43.9 %
WBC: 7 10*3/uL (ref 3.8–10.8)

## 2019-09-24 LAB — LIPID PANEL
Cholesterol: 152 mg/dL (ref <200)
HDL: 62 mg/dL (ref >=50)
LDL Cholesterol (Calc): 69 mg/dL (calc)
Non-HDL Cholesterol (Calc): 90 mg/dL (calc) (ref <130)
Total CHOL/HDL Ratio: 2.5 (calc) (ref <5.0)
Triglycerides: 131 mg/dL (ref <150)

## 2019-09-24 LAB — VITAMIN D 25 HYDROXY (VIT D DEFICIENCY, FRACTURES): Vit D, 25-Hydroxy: 21 ng/mL — ABNORMAL LOW (ref 30–100)

## 2019-09-28 LAB — CYTOLOGY - PAP
Comment: NEGATIVE
Diagnosis: NEGATIVE
High risk HPV: NEGATIVE

## 2019-10-19 ENCOUNTER — Other Ambulatory Visit: Payer: Self-pay

## 2019-10-19 ENCOUNTER — Telehealth (INDEPENDENT_AMBULATORY_CARE_PROVIDER_SITE_OTHER): Payer: Self-pay | Admitting: Gastroenterology

## 2019-10-19 DIAGNOSIS — Z1211 Encounter for screening for malignant neoplasm of colon: Secondary | ICD-10-CM

## 2019-10-19 MED ORDER — NA SULFATE-K SULFATE-MG SULF 17.5-3.13-1.6 GM/177ML PO SOLN: 1.0000 | Freq: Once | ORAL | 0 refills | Status: AC

## 2019-10-19 NOTE — Progress Notes (Signed)
Gastroenterology Pre-Procedure Review  Request Date: Friday 11/18/19 Requesting Physician: Dr. Vicente Males  PATIENT REVIEW QUESTIONS: The patient responded to the following health history questions as indicated:    1. Are you having any GI issues? no 2. Do you have a personal history of Polyps? no 3. Do you have a family history of Colon Cancer or Polyps? yes (father colon polyps) 4. Diabetes Mellitus? no 5. Joint replacements in the past 12 months?no 6. Major health problems in the past 3 months?no 7. Any artificial heart valves, MVP, or defibrillator?no    MEDICATIONS & ALLERGIES:    Patient reports the following regarding taking any anticoagulation/antiplatelet therapy:   Plavix, Coumadin, Eliquis, Xarelto, Lovenox, Pradaxa, Brilinta, or Effient? no Aspirin? no  Patient confirms/reports the following medications:  Current Outpatient Medications  Medication Sig Dispense Refill  . Ascorbic Acid (VITAMIN C) 1000 MG tablet Take 1,000 mg by mouth daily.    . Multiple Vitamins-Minerals (HAIR SKIN AND NAILS FORMULA PO) Take 1 tablet by mouth daily.    . valACYclovir (VALTREX) 500 MG tablet Take 1 tablet (500 mg total) by mouth 2 (two) times daily. 90 tablet 3  . Vitamin D, Ergocalciferol, (DRISDOL) 1.25 MG (50000 UNIT) CAPS capsule Take 1 capsule (50,000 Units total) by mouth every 7 (seven) days. 12 capsule 3  . ELDERBERRY PO Take by mouth. (Patient not taking: Reported on 10/19/2019)    . Na Sulfate-K Sulfate-Mg Sulf 17.5-3.13-1.6 GM/177ML SOLN Take 1 kit by mouth once for 1 dose. 354 mL 0   No current facility-administered medications for this visit.    Patient confirms/reports the following allergies:  Allergies  Allergen Reactions  . Influenza Vaccines Rash  . Percocet [Oxycodone-Acetaminophen] Other (See Comments)    Sensitivity-made her feel uncomfortable    No orders of the defined types were placed in this encounter.   AUTHORIZATION INFORMATION Primary  Insurance: 1D#: Group #:  Secondary Insurance: 1D#: Group #:  SCHEDULE INFORMATION: Date: Friday 11/18/19 Time: Location:[facility]

## 2019-11-09 ENCOUNTER — Telehealth: Payer: Self-pay

## 2019-11-09 NOTE — Telephone Encounter (Signed)
Patient called and left a voicemail and wants to cancel her colonoscopy scheduled for 11/18/2019

## 2019-11-18 ENCOUNTER — Ambulatory Visit: Admit: 2019-11-18 | Payer: 59 | Admitting: Gastroenterology

## 2019-11-18 SURGERY — COLONOSCOPY WITH PROPOFOL
Anesthesia: General

## 2020-04-25 ENCOUNTER — Ambulatory Visit
Admission: RE | Admit: 2020-04-25 | Discharge: 2020-04-25 | Disposition: A | Discharge status: Home / Self Care | Payer: Self-pay | Source: Ambulatory Visit | Attending: Family Medicine | Admitting: Family Medicine

## 2020-04-25 ENCOUNTER — Other Ambulatory Visit: Payer: Self-pay

## 2020-04-25 DIAGNOSIS — R928 Other abnormal and inconclusive findings on diagnostic imaging of breast: Secondary | ICD-10-CM

## 2020-05-30 ENCOUNTER — Encounter: Payer: Self-pay | Admitting: Family Medicine

## 2020-08-08 DIAGNOSIS — H6122 Impacted cerumen, left ear: Secondary | ICD-10-CM | POA: Diagnosis not present

## 2020-08-08 DIAGNOSIS — H6591 Unspecified nonsuppurative otitis media, right ear: Secondary | ICD-10-CM | POA: Diagnosis not present

## 2020-09-04 ENCOUNTER — Encounter (HOSPITAL_COMMUNITY): Payer: Self-pay

## 2020-09-04 ENCOUNTER — Other Ambulatory Visit: Payer: Self-pay

## 2020-09-04 ENCOUNTER — Ambulatory Visit (HOSPITAL_COMMUNITY)
Admission: RE | Admit: 2020-09-04 | Discharge: 2020-09-04 | Disposition: A | Discharge status: Home / Self Care | Payer: BC Managed Care – PPO | Source: Ambulatory Visit | Attending: Student | Admitting: Student

## 2020-09-04 VITALS — BP 129/85 | HR 82 | Temp 98.5°F | Resp 18

## 2020-09-04 DIAGNOSIS — R7303 Prediabetes: Secondary | ICD-10-CM | POA: Diagnosis not present

## 2020-09-04 DIAGNOSIS — B350 Tinea barbae and tinea capitis: Secondary | ICD-10-CM | POA: Diagnosis not present

## 2020-09-04 DIAGNOSIS — Z0189 Encounter for other specified special examinations: Secondary | ICD-10-CM | POA: Diagnosis not present

## 2020-09-04 LAB — COMPREHENSIVE METABOLIC PANEL
ALT: 16 U/L (ref 0–44)
AST: 20 U/L (ref 15–41)
Albumin: 4.2 g/dL (ref 3.5–5.0)
Alkaline Phosphatase: 41 U/L (ref 38–126)
Anion gap: 9 (ref 5–15)
BUN: 11 mg/dL (ref 6–20)
CO2: 28 mmol/L (ref 22–32)
Calcium: 9.7 mg/dL (ref 8.9–10.3)
Chloride: 101 mmol/L (ref 98–111)
Creatinine, Ser: 0.96 mg/dL (ref 0.44–1.00)
GFR, Estimated: >60 mL/min (ref >60)
Glucose, Bld: 84 mg/dL (ref 70–99)
Potassium: 4.8 mmol/L (ref 3.5–5.1)
Sodium: 138 mmol/L (ref 135–145)
Total Bilirubin: 0.6 mg/dL (ref 0.3–1.2)
Total Protein: 7.5 g/dL (ref 6.5–8.1)

## 2020-09-04 MED ORDER — GRISEOFULVIN MICROSIZE 500 MG PO TABS: 500.0000 mg | ORAL_TABLET | Freq: Every day | ORAL | 0 refills | Status: AC

## 2020-09-04 MED ORDER — PREDNISONE 20 MG PO TABS
20.0000 mg | ORAL_TABLET | Freq: Every day | ORAL | 0 refills | Status: AC
Start: 2020-09-04 — End: 2020-09-09

## 2020-09-04 NOTE — Discharge Instructions (Addendum)
-  You have a kerion. This is a pus-filled sore caused by a fungal infection. It can start as ringworm. It usually appears on your scalp. It may look yellow or crusty, ooze pus, feel soft to the touch and cause hair loss. -Start the antifungal, griseofulvin once daily for up to 6 weeks.  You may notice improvement before then, but continue the medication for at least 2 weeks following resolution of symptoms. -Prednisone 1 pill daily for 5 days, try taking this earlier in the day as it can give you energy.  This will help reduce the inflammation of your scalp. -You can also use selsun blue shampoo for additional relief. -Follow-up with your primary care in about 3 weeks for lab recheck.  It is important to make sure that your kidney and liver function is unchanged throughout the course of treatment. -Avoid pregnancy while on this medication. Use condoms or backup contraception if necessary.

## 2020-09-04 NOTE — ED Provider Notes (Signed)
[facility]    CSN: [id number] Arrival date & time: 09/04/20  1837      History   Chief Complaint Chief Complaint  Patient presents with   Appointment   Sore    head    HPI Alexis Golden is a 50 y.o. female  presenting with fungal infection scalp.  Medical history genital herpes, prediabetes.  Notes sore on the left side of her head for about 1 week, getting bigger and there is pus coming out. Describes this as scant purulent and clear discharge. States the area is tender. Denies fevers/chills.   HPI  Past Medical History:  Diagnosis Date   Fibroids    Genital herpes     Patient Active Problem List   Diagnosis Date Noted   History of uterine fibroid 04/28/2018   Genital herpes 04/28/2018   Pre-diabetes 03/03/2017    Past Surgical History:  Procedure Laterality Date   MYOMECTOMY  04/2003:06/2012   WISDOM TOOTH EXTRACTION  1990's    OB History     Gravida  1   Para  0   Term      Preterm      AB  1   Living  0      SAB      IAB      Ectopic      Multiple      Live Births               Home Medications    Prior to Admission medications   Medication Sig Start Date End Date Taking? Authorizing Provider  griseofulvin (GRIFULVIN V) 500 MG tablet Take 1 tablet (500 mg total) by mouth daily. 09/04/20 10/16/20 Yes Hazel Sams, PA-C  predniSONE (DELTASONE) 20 MG tablet Take 1 tablet (20 mg total) by mouth daily for 5 days. 09/04/20 09/09/20 Yes Hazel Sams, PA-C  Ascorbic Acid (VITAMIN C) 1000 MG tablet Take 1,000 mg by mouth daily.    [provider]  ELDERBERRY PO Take by mouth. Patient not taking: Reported on 10/19/2019    [provider]  Multiple Vitamins-Minerals (HAIR SKIN AND NAILS FORMULA PO) Take 1 tablet by mouth daily.    [provider]  valACYclovir (VALTREX) 500 MG tablet Take 1 tablet (500 mg total) by mouth 2 (two) times daily. 09/23/19   Steele Sizer, MD  Vitamin D, Ergocalciferol,  (DRISDOL) 1.25 MG (50000 UNIT) CAPS capsule Take 1 capsule (50,000 Units total) by mouth every 7 (seven) days. 09/23/19   Steele Sizer, MD    Family History Family History  Problem Relation Age of Onset   Hypertension Mother    Diabetes type II Mother    Hypertension Father    Hypertension Maternal Grandmother    Diabetes Mellitus II Maternal Grandmother    Hypertension Paternal Grandmother    Stroke Paternal Grandmother    COPD Paternal Grandfather    Hypertension Maternal Aunt    COPD Paternal Aunt    Breast cancer Neg Hx     Social History Social History   Tobacco Use   Smoking status: Never   Smokeless tobacco: Never  Vaping Use   Vaping Use: Never used  Substance Use Topics   Alcohol use: Yes    Alcohol/week: 1.0 standard drink    Types: 1 Glasses of wine per week   Drug use: No     Allergies   Influenza vaccines and Percocet [oxycodone-acetaminophen]   Review of Systems Review of Systems  Skin:  Positive for rash.  All other systems reviewed and are negative.   Physical Exam Triage Vital Signs ED Triage Vitals  Enc Vitals Group     BP      Pulse      Resp      Temp      Temp src      SpO2      Weight      Height      Head Circumference      Peak Flow      Pain Score      Pain Loc      Pain Edu?      Excl. in [place]?    No data found.  Updated Vital Signs BP 129/85   Pulse 82   Temp 98.5 F (36.9 C) (Oral)   Resp 18   LMP 08/29/2020 (Exact Date)   SpO2 100%   Visual Acuity Right Eye Distance:   Left Eye Distance:   Bilateral Distance:    Right Eye Near:   Left Eye Near:    Bilateral Near:     Physical Exam Vitals reviewed.  Constitutional:      General: She is not in acute distress.    Appearance: Normal appearance. She is not ill-appearing or diaphoretic.  HENT:     Head: Normocephalic and atraumatic.  Cardiovascular:     Rate and Rhythm: Normal rate and regular rhythm.     Heart sounds: Normal heart sounds.  Pulmonary:      Effort: Pulmonary effort is normal.     Breath sounds: Normal breath sounds.  Skin:    General: Skin is warm.     Comments: See image below L temple with 3x4cm area of bogginess and scaling with scant clear discharge. No warmth. TTP. No surrounding erythema.   Neurological:     General: No focal deficit present.     Mental Status: She is alert and oriented to person, place, and time.  Psychiatric:        Mood and Affect: Mood normal.        Behavior: Behavior normal.        Thought Content: Thought content normal.        Judgment: Judgment normal.       UC Treatments / Results  Labs (all labs ordered are listed, but only abnormal results are displayed) Labs Reviewed  COMPREHENSIVE METABOLIC PANEL    EKG   Radiology No results found.  Procedures Procedures (including critical care time)  Medications Ordered in UC Medications - No data to display  Initial Impression / Assessment and Plan / UC Course  I have reviewed the triage vital signs and the nursing notes.  Pertinent labs & imaging results that were available during my care of the patient were reviewed by me and considered in my medical decision making (see chart for details).     This patient is a 50 year old female presenting with kerion. Febrile, nontachycardic.  She is a prediabetic, A1c 5.6 09/2019.  Denies history of kidney or liver disease.  Normal CMP one year ago. Will recheck again today.   Griseofulvan sent as below. Patient will f/u with PCP in 3 weeks for monitoring and CMP recheck.   States she is not pregnant.  ED return precautions discussed. Patient verbalizes understanding and agreement.    Final Clinical Impressions(s) / UC Diagnoses   Final diagnoses:  Kerion  Prediabetes  Routine lab draw     Discharge Instructions      -  You have a kerion. This is a pus-filled sore caused by a fungal infection. It can start as ringworm. It usually appears on your scalp. It may look  yellow or crusty, ooze pus, feel soft to the touch and cause hair loss. -Start the antifungal, griseofulvin once daily for up to 6 weeks.  You may notice improvement before then, but continue the medication for at least 2 weeks following resolution of symptoms. -Prednisone 1 pill daily for 5 days, try taking this earlier in the day as it can give you energy.  This will help reduce the inflammation of your scalp. -You can also use selsun blue shampoo for additional relief. -Follow-up with your primary care in about 3 weeks for lab recheck.  It is important to make sure that your kidney and liver function is unchanged throughout the course of treatment. -Avoid pregnancy while on this medication. Use condoms or backup contraception if necessary.     ED Prescriptions     Medication Sig Dispense Auth. Provider   griseofulvin (GRIFULVIN V) 500 MG tablet Take 1 tablet (500 mg total) by mouth daily. 42 tablet Hazel Sams, PA-C   predniSONE (DELTASONE) 20 MG tablet Take 1 tablet (20 mg total) by mouth daily for 5 days. 5 tablet Hazel Sams, PA-C      PDMP not reviewed this encounter.   Hazel Sams, PA-C 09/04/20 2026

## 2020-09-04 NOTE — ED Triage Notes (Signed)
Pt c/o a sore on top of the left side of the head. She states it has been there for 1 week and states the sore has been getting larger. She states she noticed there is puss coming out of it.

## 2020-09-21 ENCOUNTER — Other Ambulatory Visit: Payer: Self-pay | Admitting: Family Medicine

## 2020-09-21 DIAGNOSIS — E559 Vitamin D deficiency, unspecified: Secondary | ICD-10-CM

## 2020-09-28 ENCOUNTER — Encounter: Payer: 59 | Admitting: Family Medicine

## 2020-10-30 NOTE — Patient Instructions (Addendum)
[place], [place], [place] [address] [place]:[phone] Dr. Vicente Males   Preventive Care 49-50 Years Old, Female Preventive care refers to lifestyle choices and visits with your health care provider that can promote health and wellness. This includes: A yearly physical exam. This is also called an annual wellness visit. Regular dental and eye exams. Immunizations. Screening for certain conditions. Healthy lifestyle choices, such as: Eating a healthy diet. Getting regular exercise. Not using drugs or products that contain nicotine and tobacco. Limiting alcohol use. What can I expect for my preventive care visit? Physical exam Your health care provider will check your: Height and weight. These may be used to calculate your BMI (body mass index). BMI is a measurement that tells if you are at a healthy weight. Heart rate and blood pressure. Body temperature. Skin for abnormal spots. Counseling Your health care provider may ask you questions about your: Past medical problems. Family's medical history. Alcohol, tobacco, and drug use. Emotional well-being. Home life and relationship well-being. Sexual activity. Diet, exercise, and sleep habits. Work and work Statistician. Access to firearms. Method of birth control. Menstrual cycle. Pregnancy history. What immunizations do I need?  Vaccines are usually given at various ages, according to a schedule. Your health care provider will recommend vaccines for you based on your age, medicalhistory, and lifestyle or other factors, such as travel or where you work. What tests do I need? Blood tests Lipid and cholesterol levels. These may be checked every 5 years, or more often if you are over 14 years old. Hepatitis C test. Hepatitis B test. Screening Lung cancer screening. You may have this screening every year starting at age 70 if you have a 30-pack-year history of smoking and currently smoke or have quit within the past 15  years. Colorectal cancer screening. All adults should have this screening starting at age 15 and continuing until age 34. Your health care provider may recommend screening at age 44 if you are at increased risk. You will have tests every 1-10 years, depending on your results and the type of screening test. Diabetes screening. This is done by checking your blood sugar (glucose) after you have not eaten for a while (fasting). You may have this done every 1-3 years. Mammogram. This may be done every 1-2 years. Talk with your health care provider about when you should start having regular mammograms. This may depend on whether you have a family history of breast cancer. BRCA-related cancer screening. This may be done if you have a family history of breast, ovarian, tubal, or peritoneal cancers. Pelvic exam and Pap test. This may be done every 3 years starting at age 30. Starting at age 59, this may be done every 5 years if you have a Pap test in combination with an HPV test. Other tests STD (sexually transmitted disease) testing, if you are at risk. Bone density scan. This is done to screen for osteoporosis. You may have this scan if you are at high risk for osteoporosis. Talk with your health care provider about your test results, treatment options,and if necessary, the need for more tests. Follow these instructions at home: Eating and drinking  Eat a diet that includes fresh fruits and vegetables, whole grains, lean protein, and low-fat dairy products. Take vitamin and mineral supplements as recommended by your health care provider. Do not drink alcohol if: Your health care provider tells you not to drink. You are pregnant, may be pregnant, or are planning to become pregnant. If you  drink alcohol: Limit how much you have to 0-1 drink a day. Be aware of how much alcohol is in your drink. In the [place] one drink equals one 12 oz bottle of beer (355 mL), one 5 oz glass of wine (148 mL), or one  1 oz glass of hard liquor (44 mL).  Lifestyle Take daily care of your teeth and gums. Brush your teeth every morning and night with fluoride toothpaste. Floss one time each day. Stay active. Exercise for at least 30 minutes 5 or more days each week. Do not use any products that contain nicotine or tobacco, such as cigarettes, e-cigarettes, and chewing tobacco. If you need help quitting, ask your health care provider. Do not use drugs. If you are sexually active, practice safe sex. Use a condom or other form of protection to prevent STIs (sexually transmitted infections). If you do not wish to become pregnant, use a form of birth control. If you plan to become pregnant, see your health care provider for a prepregnancy visit. If told by your health care provider, take low-dose aspirin daily starting at age 77. Find healthy ways to cope with stress, such as: Meditation, yoga, or listening to music. Journaling. Talking to a trusted person. Spending time with friends and family. Safety Always wear your seat belt while driving or riding in a vehicle. Do not drive: If you have been drinking alcohol. Do not ride with someone who has been drinking. When you are tired or distracted. While texting. Wear a helmet and other protective equipment during sports activities. If you have firearms in your house, make sure you follow all gun safety procedures. What's next? Visit your health care provider once a year for an annual wellness visit. Ask your health care provider how often you should have your eyes and teeth checked. Stay up to date on all vaccines. This information is not intended to replace advice given to you by your health care provider. Make sure you discuss any questions you have with your healthcare provider. Document Revised: 12/13/2019 Document Reviewed: 11/19/2017 Elsevier Patient Education  2022 [employer].

## 2020-10-30 NOTE — Progress Notes (Addendum)
Name: Alexis Golden   MRN: [id number]    DOB: Aug 28, 1970   Date:10/31/2020       Progress Note  Subjective  Chief Complaint  Annual Exam  HPI  Patient presents for annual CPE.  Diet: she has been eating out more since she has been dating , but eats healthy at home  Exercise: continue regular physical activity   [place] Office Visit from 10/31/2020 in [facility]  AUDIT-C Score 2      Depression: Phq 9 is  negative Depression screen [facility] 2/9 10/31/2020 09/23/2019 04/28/2018  Decreased Interest 0 0 0  Down, Depressed, Hopeless 0 0 0  PHQ - 2 Score 0 0 0  Altered sleeping - 0 0  Tired, decreased energy - 0 0  Change in appetite - 0 0  Feeling bad or failure about yourself  - 0 0  Trouble concentrating - 0 0  Moving slowly or fidgety/restless - 0 0  Suicidal thoughts - 0 0  PHQ-9 Score - 0 0  Difficult doing work/chores - - Not difficult at all   Hypertension: BP Readings from Last 3 Encounters:  10/31/20 126/78  09/04/20 129/85  09/23/19 124/84   Obesity: Wt Readings from Last 3 Encounters:  10/31/20 154 lb (69.9 kg)  09/23/19 157 lb 14.4 oz (71.6 kg)  04/28/18 147 lb 1.6 oz (66.7 kg)   BMI Readings from Last 3 Encounters:  10/31/20 26.43 kg/m  09/23/19 27.10 kg/m  04/28/18 25.25 kg/m     Vaccines:   Shingrix: not done Pneumonia: educated and discussed with patient. Flu: educated and discussed with patient.  Hep C Screening: 04/28/18 STD testing and prevention (HIV/chl/gon/syphilis): 04/28/18 Intimate partner violence:negative Sexual History :sexually active, new partner - he had a vasectomy  Menstrual History/LMP/Abnormal Bleeding: cycles are regular Incontinence Symptoms: some frequency but no urgency   Breast cancer:  - Last Mammogram: 04/25/20 - BRCA gene screening: N/A  Osteoporosis: Discussed high calcium and vitamin D supplementation, weight bearing exercises  Cervical cancer screening: 09/23/19 but new partner and we will recheck  it today   Skin cancer: Discussed monitoring for atypical lesions  Colorectal cancer: she will call and re-schedule  Lung cancer: Low Dose CT Chest recommended if Age 50-80 years, 20 pack-year currently smoking OR have quit w/in 15years. Patient does not qualify.   ECG: N/A  Advanced Care Planning: A voluntary discussion about advance care planning including the explanation and discussion of advance directives.  Discussed health care proxy and Living will, and the patient was able to identify a health care proxy as parents.    Lipids: Lab Results  Component Value Date   CHOL 152 09/23/2019   CHOL 151 04/28/2018   CHOL 159 02/18/2017   Lab Results  Component Value Date   HDL 62 09/23/2019   HDL 60 04/28/2018   HDL 44 02/18/2017   Lab Results  Component Value Date   LDLCALC 69 09/23/2019   LDLCALC 73 04/28/2018   LDLCALC 87 02/18/2017   Lab Results  Component Value Date   TRIG 131 09/23/2019   TRIG 94 04/28/2018   TRIG 140 02/18/2017   Lab Results  Component Value Date   CHOLHDL 2.5 09/23/2019   CHOLHDL 2.5 04/28/2018   CHOLHDL 3.6 02/18/2017   No results found for: LDLDIRECT  Glucose: Glucose, Bld  Date Value Ref Range Status  09/04/2020 84 70 - 99 mg/dL Final    Comment:    Glucose reference range applies only to samples  taken after fasting for at least 8 hours.  09/23/2019 72 65 - 99 mg/dL Final    Comment:    .            Fasting reference interval .   04/28/2018 86 65 - 99 mg/dL Final    Comment:    .            Fasting reference interval .     Patient Active Problem List   Diagnosis Date Noted   History of uterine fibroid 04/28/2018   Genital herpes 04/28/2018   Pre-diabetes 03/03/2017    Past Surgical History:  Procedure Laterality Date   MYOMECTOMY  04/2003:06/2012   WISDOM TOOTH EXTRACTION  50's    Family History  Problem Relation Age of Onset   Hypertension Mother    Diabetes type II Mother    Hypertension Father    Hypertension  Maternal Grandmother    Diabetes Mellitus II Maternal Grandmother    Hypertension Paternal Grandmother    Stroke Paternal Grandmother    COPD Paternal Grandfather    Hypertension Maternal Aunt    COPD Paternal Aunt    Breast cancer Neg Hx     Social History   Socioeconomic History   Marital status: Divorced    Spouse name: Not on file   Number of children: 0   Years of education: Not on file   Highest education level: Bachelor's degree (e.g., BA, AB, BS)  Occupational History   Occupation: Customer Service  Tobacco Use   Smoking status: Never   Smokeless tobacco: Never  Vaping Use   Vaping Use: Never used  Substance and Sexual Activity   Alcohol use: Yes    Alcohol/week: 1.0 standard drink    Types: 1 Glasses of wine per week   Drug use: No   Sexual activity: Not Currently    Partners: Male    Birth control/protection: None  Other Topics Concern   Not on file  Social History Narrative   She was married for 6 years,  divorced since 03/2017    Tried to have children but unable.    Social Determinants of Health   Financial Resource Strain: Low Risk    Difficulty of Paying Living Expenses: Not hard at all  Food Insecurity: No Food Insecurity   Worried About Charity fundraiser in the Last Year: Never true   [place] in the Last Year: Never true  Transportation Needs: No Transportation Needs   Lack of Transportation (Medical): No   Lack of Transportation (Non-Medical): No  Physical Activity: Sufficiently Active   Days of Exercise per Week: 3 days   Minutes of Exercise per Session: 60 min  Stress: No Stress Concern Present   Feeling of Stress : Not at all  Social Connections: Moderately Integrated   Frequency of Communication with Friends and Family: More than three times a week   Frequency of Social Gatherings with Friends and Family: Three times a week   Attends Religious Services: More than 4 times per year   Active Member of Clubs or Organizations: Yes    Attends Music therapist: More than 4 times per year   Marital Status: Divorced  Human resources officer Violence: Not At Risk   Fear of Current or Ex-Partner: No   Emotionally Abused: No   Physically Abused: No   Sexually Abused: No     Current Outpatient Medications:    Cholecalciferol (VITAMIN D) 50 MCG (2000 UT) CAPS, Take  1 capsule by mouth daily., Disp: , Rfl:    Multiple Vitamins-Minerals (HAIR SKIN AND NAILS FORMULA PO), Take 1 tablet by mouth daily., Disp: , Rfl:    valACYclovir (VALTREX) 500 MG tablet, Take 1 tablet (500 mg total) by mouth 2 (two) times daily., Disp: 90 tablet, Rfl: 3  Allergies  Allergen Reactions   Percocet [Oxycodone-Acetaminophen] Other (See Comments)    Sensitivity-made her feel uncomfortable     ROS  Constitutional: Negative for fever or weight change.  Respiratory: Negative for cough and shortness of breath.   Cardiovascular: Negative for chest pain or palpitations.  Gastrointestinal: Negative for abdominal pain, no bowel changes.  Musculoskeletal: positive for right knee pain, stepped on a hole and hyperextended knee she will try PT  Skin: positive rash on left side of scalp - seen by urgent care and finished medication a couple of weeks ago.  Neurological: Negative for dizziness or headache.  No other specific complaints in a complete review of systems (except as listed in HPI above).   Objective  Vitals:   10/31/20 0914  BP: 126/78  Pulse: 91  Resp: 16  Temp: 98.2 F (36.8 C)  SpO2: 97%  Weight: 154 lb (69.9 kg)  Height: 5' 4" (1.626 m)    Body mass index is 26.43 kg/m.  Physical Exam  Constitutional: Patient appears well-developed and well-nourished. No distress.  HENT: Head: Normocephalic and atraumatic. Ears: B TMs ok, no erythema or effusion; Nose: Nose normal. Mouth/Throat: not done  Eyes: Conjunctivae and EOM are normal. Pupils are equal, round, and reactive to light. No scleral icterus.  Neck: Normal range of  motion. Neck supple. No JVD present. No thyromegaly present.  Cardiovascular: Normal rate, regular rhythm and normal heart sounds.  No murmur heard. No BLE edema. Pulmonary/Chest: Effort normal and breath sounds normal. No respiratory distress. Abdominal: Soft. Bowel sounds are normal, no distension. There is no tenderness. no masses Breast: mass at 11 o'clock right breast  no nipple discharge or rashes FEMALE GENITALIA:  External genitalia normal External urethra normal Vaginal vault normal without discharge or lesions Cervix normal without discharge or lesions Bimanual exam normal without masses RECTAL: not done  Musculoskeletal: Normal range of motion, no joint effusions. No gross deformities  Neurological: he is alert and oriented to person, place, and time. No cranial nerve deficit. Coordination, balance, strength, speech and gait are normal.  Skin: Skin is warm and dry. No rash noted. No erythema. Large round area of alopecia  on left temporal area Psychiatric: Patient has a normal mood and affect. behavior is normal. Judgment and thought content normal.   Recent Results (from the past 2160 hour(s))  Comprehensive metabolic panel     Status: None   Collection Time: 09/04/20  7:52 PM  Result Value Ref Range   Sodium 138 135 - 145 mmol/L   Potassium 4.8 3.5 - 5.1 mmol/L   Chloride 101 98 - 111 mmol/L   CO2 28 22 - 32 mmol/L   Glucose, Bld 84 70 - 99 mg/dL    Comment: Glucose reference range applies only to samples taken after fasting for at least 8 hours.   BUN 11 6 - 20 mg/dL   Creatinine, Ser 0.96 0.44 - 1.00 mg/dL   Calcium 9.7 8.9 - 10.3 mg/dL   Total Protein 7.5 6.5 - 8.1 g/dL   Albumin 4.2 3.5 - 5.0 g/dL   AST 20 15 - 41 U/L   ALT 16 0 - 44 U/L   Alkaline  Phosphatase 41 38 - 126 U/L   Total Bilirubin 0.6 0.3 - 1.2 mg/dL   GFR, Estimated >60 >60 mL/min    Comment: (NOTE) Calculated using the CKD-EPI Creatinine Equation (2021)    Anion gap 9 5 - 15    Comment:  Performed at [facility] [address], [place], [place] [address]    Fall Risk: Fall Risk  10/31/2020 09/23/2019 04/28/2018  Falls in the past year? 0 0 0  Number falls in past yr: 0 0 -  Injury with Fall? 0 0 -     Functional Status Survey: Is the patient deaf or have difficulty hearing?: No Does the patient have difficulty seeing, even when wearing glasses/contacts?: No Does the patient have difficulty concentrating, remembering, or making decisions?: No Does the patient have difficulty walking or climbing stairs?: No Does the patient have difficulty dressing or bathing?: No Does the patient have difficulty doing errands alone such as visiting a doctor's office or shopping?: No   Assessment & Plan  1. Well adult exam  - MM 3D SCREEN BREAST BILATERAL; Future - Lipid panel - CBC with Differential/Platelet - COMPLETE METABOLIC PANEL WITH GFR - Hemoglobin A1c - VITAMIN D 25 Hydroxy (Vit-D Deficiency, Fractures)  2. Vitamin D deficiency  - VITAMIN D 25 Hydroxy (Vit-D Deficiency, Fractures)  3. Lipid screening  - Lipid panel  4. Screening, iron deficiency anemia  - CBC with Differential/Platelet  5. Hyperglycemia  - Hemoglobin A1c  6. Colon cancer screening  She will call GI and re-schedule it   7. Breast cancer screening by mammogram  - MM 3D SCREEN BREAST BILATERAL; Future  8. Need for shingles vaccine  Up to date, had it at the pharmacy   9. Cervical cancer screening  - Cytology - PAP  10. 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  Dispense: 90 tablet; Refill: 3   12. Breast lump on right side at 11 o'clock position  - MM Digital Diagnostic Bilat; Future - US BREAST LTD UNI RIGHT INC AXILLA; Future  -USPSTF grade A and B recommendations  reviewed with patient; age-appropriate recommendations, preventive care, screening tests, etc discussed and encouraged; healthy living encouraged; see AVS for patient education given to patient -Discussed importance of 150 minutes of physical activity weekly, eat two servings of fish weekly, eat one serving of tree nuts ( cashews, pistachios, pecans, almonds.Marland Kitchen) every other day, eat 6 servings of fruit/vegetables daily and drink plenty of water and avoid sweet beverages.

## 2020-10-31 ENCOUNTER — Ambulatory Visit (INDEPENDENT_AMBULATORY_CARE_PROVIDER_SITE_OTHER): Payer: BC Managed Care – PPO | Admitting: Family Medicine

## 2020-10-31 ENCOUNTER — Encounter: Payer: Self-pay | Admitting: Family Medicine

## 2020-10-31 ENCOUNTER — Other Ambulatory Visit: Payer: Self-pay

## 2020-10-31 ENCOUNTER — Other Ambulatory Visit (HOSPITAL_COMMUNITY)
Admission: RE | Admit: 2020-10-31 | Discharge: 2020-10-31 | Disposition: A | Discharge status: Home / Self Care | Payer: BC Managed Care – PPO | Source: Ambulatory Visit | Attending: Family Medicine | Admitting: Family Medicine

## 2020-10-31 VITALS — BP 126/78 | HR 91 | Temp 98.2°F | Resp 16 | Ht 64.0 in | Wt 154.0 lb

## 2020-10-31 DIAGNOSIS — A6004 Herpesviral vulvovaginitis: Secondary | ICD-10-CM

## 2020-10-31 DIAGNOSIS — Z113 Encounter for screening for infections with a predominantly sexual mode of transmission: Secondary | ICD-10-CM

## 2020-10-31 DIAGNOSIS — E559 Vitamin D deficiency, unspecified: Secondary | ICD-10-CM

## 2020-10-31 DIAGNOSIS — Z0000 Encounter for general adult medical examination without abnormal findings: Secondary | ICD-10-CM | POA: Diagnosis not present

## 2020-10-31 DIAGNOSIS — R739 Hyperglycemia, unspecified: Secondary | ICD-10-CM | POA: Diagnosis not present

## 2020-10-31 DIAGNOSIS — Z124 Encounter for screening for malignant neoplasm of cervix: Secondary | ICD-10-CM

## 2020-10-31 DIAGNOSIS — Z13220 Encounter for screening for lipoid disorders: Secondary | ICD-10-CM | POA: Diagnosis not present

## 2020-10-31 DIAGNOSIS — N6311 Unspecified lump in the right breast, upper outer quadrant: Secondary | ICD-10-CM

## 2020-10-31 DIAGNOSIS — Z1211 Encounter for screening for malignant neoplasm of colon: Secondary | ICD-10-CM

## 2020-10-31 DIAGNOSIS — Z23 Encounter for immunization: Secondary | ICD-10-CM

## 2020-10-31 DIAGNOSIS — Z1231 Encounter for screening mammogram for malignant neoplasm of breast: Secondary | ICD-10-CM

## 2020-10-31 DIAGNOSIS — Z130 Encounter for screening for diseases of the blood and blood-forming organs and certain disorders involving the immune mechanism: Secondary | ICD-10-CM | POA: Diagnosis not present

## 2020-10-31 MED ORDER — VALACYCLOVIR HCL 500 MG PO TABS: 500.0000 mg | ORAL_TABLET | Freq: Two times a day (BID) | ORAL | 3 refills | Status: DC

## 2020-10-31 NOTE — Addendum Note (Signed)
Addended by: Carlene Coria on: 10/31/2020 10:19 AM   Modules accepted: Orders

## 2020-11-01 LAB — CYTOLOGY - PAP
Chlamydia: NEGATIVE
Comment: NEGATIVE
Comment: NORMAL
Diagnosis: NEGATIVE
Neisseria Gonorrhea: NEGATIVE

## 2020-11-01 LAB — COMPLETE METABOLIC PANEL WITH GFR
AG Ratio: 1.7 (calc) (ref 1.0–2.5)
ALT: 10 U/L (ref 6–29)
AST: 13 U/L (ref 10–35)
Albumin: 4.5 g/dL (ref 3.6–5.1)
Alkaline phosphatase (APISO): 45 U/L (ref 37–153)
BUN: 9 mg/dL (ref 7–25)
CO2: 28 mmol/L (ref 20–32)
Calcium: 9.5 mg/dL (ref 8.6–10.4)
Chloride: 102 mmol/L (ref 98–110)
Creat: 0.8 mg/dL (ref 0.50–1.03)
Globulin: 2.6 g/dL (calc) (ref 1.9–3.7)
Glucose, Bld: 90 mg/dL (ref 65–99)
Potassium: 4.7 mmol/L (ref 3.5–5.3)
Sodium: 140 mmol/L (ref 135–146)
Total Bilirubin: 0.4 mg/dL (ref 0.2–1.2)
Total Protein: 7.1 g/dL (ref 6.1–8.1)
eGFR: 90 mL/min/{1.73_m2} (ref >=60)

## 2020-11-01 LAB — LIPID PANEL
Cholesterol: 155 mg/dL (ref <200)
HDL: 59 mg/dL (ref >=50)
LDL Cholesterol (Calc): 70 mg/dL (calc)
Non-HDL Cholesterol (Calc): 96 mg/dL (calc) (ref <130)
Total CHOL/HDL Ratio: 2.6 (calc) (ref <5.0)
Triglycerides: 187 mg/dL — ABNORMAL HIGH (ref <150)

## 2020-11-01 LAB — CBC WITH DIFFERENTIAL/PLATELET
Absolute Monocytes: 566 cells/uL (ref 200–950)
Basophils Absolute: 33 cells/uL (ref 0–200)
Basophils Relative: 0.4 %
Eosinophils Absolute: 57 cells/uL (ref 15–500)
Eosinophils Relative: 0.7 %
HCT: 41.8 % (ref 35.0–45.0)
Hemoglobin: 13.4 g/dL (ref 11.7–15.5)
Lymphs Abs: 2509 cells/uL (ref 850–3900)
MCH: 29.1 pg (ref 27.0–33.0)
MCHC: 32.1 g/dL (ref 32.0–36.0)
MCV: 90.7 fL (ref 80.0–100.0)
MPV: 12.2 fL (ref 7.5–12.5)
Monocytes Relative: 6.9 %
Neutro Abs: 5035 cells/uL (ref 1500–7800)
Neutrophils Relative %: 61.4 %
Platelets: 200 10*3/uL (ref 140–400)
RBC: 4.61 10*6/uL (ref 3.80–5.10)
RDW: 13.1 % (ref 11.0–15.0)
Total Lymphocyte: 30.6 %
WBC: 8.2 10*3/uL (ref 3.8–10.8)

## 2020-11-01 LAB — HEMOGLOBIN A1C
Hgb A1c MFr Bld: 5.7 % of total Hgb — ABNORMAL HIGH (ref <5.7)
Mean Plasma Glucose: 117 mg/dL
eAG (mmol/L): 6.5 mmol/L

## 2020-11-01 LAB — RPR: RPR Ser Ql: NONREACTIVE

## 2020-11-01 LAB — HIV ANTIBODY (ROUTINE TESTING W REFLEX): HIV 1&2 Ab, 4th Generation: NONREACTIVE

## 2020-11-01 LAB — VITAMIN D 25 HYDROXY (VIT D DEFICIENCY, FRACTURES): Vit D, 25-Hydroxy: 44 ng/mL (ref 30–100)

## 2020-11-05 ENCOUNTER — Other Ambulatory Visit: Payer: Self-pay

## 2020-11-05 ENCOUNTER — Ambulatory Visit
Admission: RE | Admit: 2020-11-05 | Discharge: 2020-11-05 | Disposition: A | Discharge status: Home / Self Care | Payer: BC Managed Care – PPO | Source: Ambulatory Visit | Attending: Family Medicine | Admitting: Family Medicine

## 2020-11-05 DIAGNOSIS — N6311 Unspecified lump in the right breast, upper outer quadrant: Secondary | ICD-10-CM | POA: Diagnosis not present

## 2020-11-05 DIAGNOSIS — R922 Inconclusive mammogram: Secondary | ICD-10-CM | POA: Diagnosis not present

## 2020-11-22 ENCOUNTER — Other Ambulatory Visit: Payer: Self-pay

## 2020-11-22 ENCOUNTER — Encounter: Payer: Self-pay | Admitting: Family Medicine

## 2020-11-22 DIAGNOSIS — M25561 Pain in right knee: Secondary | ICD-10-CM

## 2020-11-28 ENCOUNTER — Encounter: Payer: Self-pay | Admitting: Family Medicine

## 2020-11-29 ENCOUNTER — Ambulatory Visit: Payer: BC Managed Care – PPO | Attending: Family Medicine

## 2020-11-29 ENCOUNTER — Other Ambulatory Visit: Payer: Self-pay

## 2020-11-29 DIAGNOSIS — R2689 Other abnormalities of gait and mobility: Secondary | ICD-10-CM | POA: Insufficient documentation

## 2020-11-29 DIAGNOSIS — Z1211 Encounter for screening for malignant neoplasm of colon: Secondary | ICD-10-CM

## 2020-11-29 DIAGNOSIS — M25552 Pain in left hip: Secondary | ICD-10-CM | POA: Diagnosis not present

## 2020-11-29 DIAGNOSIS — M6281 Muscle weakness (generalized): Secondary | ICD-10-CM | POA: Diagnosis not present

## 2020-11-29 DIAGNOSIS — M25662 Stiffness of left knee, not elsewhere classified: Secondary | ICD-10-CM | POA: Insufficient documentation

## 2020-11-29 DIAGNOSIS — M25562 Pain in left knee: Secondary | ICD-10-CM | POA: Insufficient documentation

## 2020-11-29 DIAGNOSIS — M25652 Stiffness of left hip, not elsewhere classified: Secondary | ICD-10-CM | POA: Diagnosis not present

## 2020-11-29 NOTE — Patient Instructions (Signed)
Access Code: [email or web address] URL: [email or web address] Date: 11/29/2020 Prepared by: Sherlynn Stalls  Exercises Seated Hamstring Stretch - 1 x daily - 7 x weekly - 4 sets - 30 seconds hold Sidelying TFL Stretch - 1 x daily - 7 x weekly - 4 sets - 30 seconds hold Clamshell - 1 x daily - 7 x weekly - 2 sets - 10 reps Supine Bridge - 1 x daily - 7 x weekly - 2 sets - 10 reps Small Range Straight Leg Raise - 1 x daily - 7 x weekly - 2 sets - 10 reps

## 2020-11-29 NOTE — Therapy (Signed)
[place] [place], [name], [address] Phone: [phone]   Fax:  [phone]  Physical Therapy Evaluation  Patient Details  Name: Alexis Golden MRN: [id number] Date of Birth: 10-Aug-1970 Referring Provider (PT): Steele Sizer, MD   Encounter Date: 11/29/2020   PT End of Session - 11/29/20 2145     Visit Number 1    Date for PT Re-Evaluation 01/24/21    Activity Tolerance Patient tolerated treatment well;Patient limited by pain    Behavior During Therapy [facility] for tasks assessed/performed             Past Medical History:  Diagnosis Date   Fibroids    Genital herpes     Past Surgical History:  Procedure Laterality Date   MYOMECTOMY  04/2003:06/2012   WISDOM TOOTH EXTRACTION  1990's    There were no vitals filed for this visit.    Subjective Assessment - 11/29/20 1815     Subjective June of this year was running on asphalt trail, there was an area that was elevated d/t a tree root and LEFT  knee hyperextended. Hasnt been back to running because of this. Was previously running regularly 3x/wk. Now only walking d/t pain, only on flat surfaces d/t pain with declines, not as much with inclines..    Diagnostic tests no imaging and has not seen ortho    Patient Stated Goals less pain, be able to increase walking, work towards running and exercise at gym    Currently in Pain? Yes    Pain Score 0-No pain   2-3/10 when walking in   Pain Location Knee    Pain Orientation Left   medial and lateral joint line.   Pain Descriptors / Indicators Aching    Pain Type Acute pain    Aggravating Factors  hurts at night, when sitting at work or in car.    Pain Relieving Factors biofreeze, changing positions in sitting or at night.                [facility] PT Assessment - 11/29/20 1820       Assessment   Medical Diagnosis M25.561 (ICD-10-CM) - Right knee pain, unspecified chronicity    Referring Provider (PT) Steele Sizer, MD    Hand Dominance Right      Home Environment   Additional Comments house, alone, 4 steps to get in  and full flight, intermittent/occasiona pain with stairs      Prior Function   Vocation Full time employment    Vocation Requirements Telephone based case management for [employer]    Leisure walking, running. wanted tpo work towards returning to gym      Cognition   Overall Cognitive Status Within Functional Limits for tasks assessed      ROM / Strength   AROM / PROM / Strength AROM;Strength      AROM   AROM Assessment Site Knee;Hip    Right/Left Hip Left    Left Hip Flexion 90   with pain at lateral hip   Right/Left Knee Left    Left Knee Extension 5   hyperext   Left Knee Flexion 135   pain at left lat hip, medial knee     Strength   Overall Strength Comments LLE grossly 4-/5 with some knee and hip pain with resisted flexion and ABD. RLE grossly 4+/5      Flexibility   Soft Tissue Assessment /Muscle Length yes    Hamstrings mod  tight on L>R    Quadriceps tight B    ITB mod tight on L>R      Transfers   Five time sit to stand comments  13 seconds, anterior knee pain on left.      Ambulation/Gait   Gait Comments antalgic, slight limp with LLE                        Objective measurements completed on examination: See above findings.                PT Education - 11/29/20 1854     Education Details PT POC and initial HEP Access Code: [email or web address]    Person(s) Educated Patient    Methods Explanation;Demonstration;Handout    Comprehension Verbalized understanding;Returned demonstration              PT Short Term Goals - 11/29/20 2159       PT SHORT TERM GOAL #1   Title independent with initial HEP    Time 2    Period Weeks    Status New    Target Date 12/13/20               PT Long Term Goals - 11/29/20 2201       PT LONG TERM GOAL #1   Title FOTO improved to at least 76% for left knee function     Baseline 59% at eval    Time 8    Period Weeks    Status New    Target Date 01/24/21      PT LONG TERM GOAL #2   Title Independent with advanced HEP and independent gym, running program    Time 8    Period Weeks    Status New    Target Date 01/24/21      PT LONG TERM GOAL #3   Title LE strength increased to at least 4+/5 with </= 2/10 pain or discomfort    Time 8    Period Weeks    Status New    Target Date 01/24/21      PT LONG TERM GOAL #4   Title Pt will report at least 50% improvement in pain with ADLs, walking, drving.    Time 8    Period Weeks    Status New    Target Date 01/24/21                    Plan - 11/29/20 2145     Clinical Impression Statement Pt is a kind 50 yo female who presents with LEFT knee pain which she reports began after a run in June of this year where she steped on an elevated/uneven area causing knee to hyperextend, cannot recall any twisting motion occuring at that time. Since then, pt has not been able to return to running, gets pain with prolonged positioning, and has been unable to get back to independent exercise d/t pain. She currently presents with decreased LLE strength, decreased SL stability on left, antalgic gait. With MMT for hip flexion and ABD pt did c/o pain at lateral left hip as well into lateral thigh, decreased LE flexibility. She will benefit from skilled PT to work on decreasing pain, improving function, and working toward PLOF.    Stability/Clinical Decision Making Stable/Uncomplicated    Clinical Decision Making Low    Rehab Potential Good    PT Frequency 2x / week    PT Duration 8 weeks  PT Treatment/Interventions ADLs/Self Care Home Management;Electrical Stimulation;Cryotherapy;Iontophoresis '4mg'$ /ml Dexamethasone;Moist Heat;Functional mobility training;Therapeutic activities;Therapeutic exercise;Balance training;Neuromuscular re-education;Patient/family education;Manual techniques;Dry needling;Taping    PT Next  Visit Plan Reassess and progress HEP as appropriate. LE flexibility and strength. Work towards decreasing pain and increasing independence with gym/machines and running    PT Home Exercise Plan see pt instructions    Consulted and Agree with Plan of Care Patient             Patient will benefit from skilled therapeutic intervention in order to improve the following deficits and impairments:  Abnormal gait, Decreased range of motion, Decreased balance, Pain, Impaired flexibility, Decreased strength  Visit Diagnosis: Left knee pain, unspecified chronicity  Stiffness of left knee, not elsewhere classified  Pain in left hip  Stiffness of left hip, not elsewhere classified  Muscle weakness (generalized)  Other abnormalities of gait and mobility     Problem List Patient Active Problem List   Diagnosis Date Noted   History of uterine fibroid 04/28/2018   Genital herpes 04/28/2018   Pre-diabetes 03/03/2017    Hall Busing, PT, DPT 11/29/2020, 10:24 PM  [place]. [place], [name], [address] Phone: [phone]   Fax:  [phone]  Name: Alexis Golden MRN: [id number] Date of Birth: 05-08-1970

## 2020-11-30 ENCOUNTER — Other Ambulatory Visit: Payer: Self-pay

## 2020-11-30 DIAGNOSIS — Z1211 Encounter for screening for malignant neoplasm of colon: Secondary | ICD-10-CM

## 2020-11-30 MED ORDER — CLENPIQ 10-3.5-12 MG-GM -GM/160ML PO SOLN: 1.0000 | ORAL | 0 refills | Status: DC

## 2020-11-30 NOTE — Progress Notes (Unsigned)
Gastroenterology Pre-Procedure Review  Request Date: 12/18/2020 Requesting Physician: Dr. Vicente Males  PATIENT REVIEW QUESTIONS: The patient responded to the following health history questions as indicated:    1. Are you having any GI issues? no 2. Do you have a personal history of Polyps? no 3. Do you have a family history of Colon Cancer or Polyps? no 4. Diabetes Mellitus? no 5. Joint replacements in the past 12 months?no 6. Major health problems in the past 3 months?no 7. Any artificial heart valves, MVP, or defibrillator?no    MEDICATIONS & ALLERGIES:    Patient reports the following regarding taking any anticoagulation/antiplatelet therapy:   Plavix, Coumadin, Eliquis, Xarelto, Lovenox, Pradaxa, Brilinta, or Effient? no Aspirin? no  Patient confirms/reports the following medications:  Current Outpatient Medications  Medication Sig Dispense Refill   Cholecalciferol (VITAMIN D) 50 MCG (2000 UT) CAPS Take 1 capsule by mouth daily.     Multiple Vitamins-Minerals (HAIR SKIN AND NAILS FORMULA PO) Take 1 tablet by mouth daily.     valACYclovir (VALTREX) 500 MG tablet Take 1 tablet (500 mg total) by mouth 2 (two) times daily. 90 tablet 3   No current facility-administered medications for this visit.    Patient confirms/reports the following allergies:  Allergies  Allergen Reactions   Percocet [Oxycodone-Acetaminophen] Other (See Comments)    Sensitivity-made her feel uncomfortable    No orders of the defined types were placed in this encounter.   AUTHORIZATION INFORMATION Primary Insurance: 1D#: Group #:  Secondary Insurance: 1D#: Group #:  SCHEDULE INFORMATION: Date: 12/18/2020 Time: Location: [facility]

## 2020-12-06 ENCOUNTER — Other Ambulatory Visit: Payer: Self-pay

## 2020-12-06 MED ORDER — PEG 3350-KCL-NA BICARB-NACL 420 G PO SOLR: ORAL | 0 refills | Status: DC

## 2020-12-10 ENCOUNTER — Encounter: Payer: Self-pay | Admitting: Rehabilitative and Restorative Service Providers"

## 2020-12-10 ENCOUNTER — Ambulatory Visit: Payer: BC Managed Care – PPO | Admitting: Rehabilitative and Restorative Service Providers"

## 2020-12-10 ENCOUNTER — Other Ambulatory Visit: Payer: Self-pay

## 2020-12-10 DIAGNOSIS — M25552 Pain in left hip: Secondary | ICD-10-CM | POA: Diagnosis not present

## 2020-12-10 DIAGNOSIS — M6281 Muscle weakness (generalized): Secondary | ICD-10-CM

## 2020-12-10 DIAGNOSIS — R2689 Other abnormalities of gait and mobility: Secondary | ICD-10-CM

## 2020-12-10 DIAGNOSIS — M25562 Pain in left knee: Secondary | ICD-10-CM | POA: Diagnosis not present

## 2020-12-10 DIAGNOSIS — M25662 Stiffness of left knee, not elsewhere classified: Secondary | ICD-10-CM | POA: Diagnosis not present

## 2020-12-10 DIAGNOSIS — M25652 Stiffness of left hip, not elsewhere classified: Secondary | ICD-10-CM

## 2020-12-10 NOTE — Therapy (Signed)
[address]. [place], [name], [address] Phone: [phone]   Fax:  [phone]  Physical Therapy Treatment  Patient Details  Name: Alexis Golden MRN: [id number] Date of Birth: October 24, 1970 Referring Provider (PT): Steele Sizer, MD   Encounter Date: 12/10/2020   PT End of Session - 12/10/20 1036     Visit Number 2    Date for PT Re-Evaluation 01/24/21    PT Start Time 1028    PT Stop Time 1106    PT Time Calculation (min) 38 min    Activity Tolerance Patient tolerated treatment well    Behavior During Therapy [facility] for tasks assessed/performed             Past Medical History:  Diagnosis Date   Fibroids    Genital herpes     Past Surgical History:  Procedure Laterality Date   MYOMECTOMY  04/2003:06/2012   WISDOM TOOTH EXTRACTION  1990's    There were no vitals filed for this visit.   Subjective Assessment - 12/10/20 1031     Subjective Pt reports some difficulty getting out of the car today.    Patient Stated Goals less pain, be able to increase walking, work towards running and exercise at gym    Currently in Pain? Yes    Pain Score 2     Pain Location Knee    Pain Orientation Left    Pain Descriptors / Indicators Aching                               [facility] Adult PT Treatment/Exercise - 12/10/20 0001       Exercises   Exercises Knee/Hip      Knee/Hip Exercises: Stretches   Gastroc Stretch Both;2 reps;20 seconds    Gastroc Stretch Limitations on floor bar      Knee/Hip Exercises: Aerobic   Nustep L3 x83mn LE only      Knee/Hip Exercises: Machines for Strengthening   Cybex Knee Extension 15# 2x10    Cybex Knee Flexion 25# 2x10    Cybex Leg Press 30# 2x10      Knee/Hip Exercises: Standing   Heel Raises Both;2 sets;10 reps    Forward Lunges Both;1 set;10 reps    Forward Lunges Limitations on bosu    Lateral Step Up Both;1 set;10 reps;Hand Hold: 0;Step Height: 6"     Forward Step Up Both;1 set;10 reps;Hand Hold: 0;Step Height: 6"    Step Down Both;1 set;10 reps;Hand Hold: 0;Step Height: 6"    Wall Squat 2 sets;10 reps      Knee/Hip Exercises: Seated   Sit to Sand 20 reps;without UE support   x10 OHP with yellow wt ball, x10 CP with yellow wt ball                      PT Short Term Goals - 12/10/20 1135       PT SHORT TERM GOAL #1   Title independent with initial HEP    Status Achieved               PT Long Term Goals - 12/10/20 1135       PT LONG TERM GOAL #1   Title FOTO improved to at least 76% for left knee function    Status On-going      PT LONG TERM GOAL #2   Title Independent with advanced HEP and  independent gym, running program    Status On-going      PT LONG TERM GOAL #3   Title LE strength increased to at least 4+/5 with </= 2/10 pain or discomfort    Status On-going      PT LONG TERM GOAL #4   Title Pt will report at least 50% improvement in pain with ADLs, walking, drving.    Status On-going                   Plan - 12/10/20 1109     Clinical Impression Statement Ms Affleck tolerated ther ex session well.  She denies increased pain during session, but does report some soreness.  Pt educated that she can perform many of these exercises at home, and she verbalizes understanding and states that she is going to add the ther ex today to her HEP.  She tolerated weight machines well required min cuing for form.    PT Treatment/Interventions ADLs/Self Care Home Management;Electrical Stimulation;Cryotherapy;Iontophoresis '4mg'$ /ml Dexamethasone;Moist Heat;Functional mobility training;Therapeutic activities;Therapeutic exercise;Balance training;Neuromuscular re-education;Patient/family education;Manual techniques;Dry needling;Taping    PT Next Visit Plan Reassess and progress HEP as appropriate. LE flexibility and strength. Work towards decreasing pain and increasing independence with gym/machines and running     Consulted and Agree with Plan of Care Patient             Patient will benefit from skilled therapeutic intervention in order to improve the following deficits and impairments:  Abnormal gait, Decreased range of motion, Decreased balance, Pain, Impaired flexibility, Decreased strength  Visit Diagnosis: Left knee pain, unspecified chronicity  Stiffness of left knee, not elsewhere classified  Pain in left hip  Stiffness of left hip, not elsewhere classified  Muscle weakness (generalized)  Other abnormalities of gait and mobility     Problem List Patient Active Problem List   Diagnosis Date Noted   History of uterine fibroid 04/28/2018   Genital herpes 04/28/2018   Pre-diabetes 03/03/2017    Juel Burrow, PT, DPT 12/10/2020, 11:37 AM  [place]. [place], [name], [address] Phone: [phone]   Fax:  [phone]  Name: Alexis Golden MRN: [id number] Date of Birth: 1971/03/12

## 2020-12-14 ENCOUNTER — Other Ambulatory Visit: Payer: Self-pay

## 2020-12-14 ENCOUNTER — Ambulatory Visit: Payer: BC Managed Care – PPO | Admitting: Physical Therapy

## 2020-12-14 DIAGNOSIS — M6281 Muscle weakness (generalized): Secondary | ICD-10-CM | POA: Diagnosis not present

## 2020-12-14 DIAGNOSIS — M25562 Pain in left knee: Secondary | ICD-10-CM

## 2020-12-14 DIAGNOSIS — M25662 Stiffness of left knee, not elsewhere classified: Secondary | ICD-10-CM | POA: Diagnosis not present

## 2020-12-14 DIAGNOSIS — M25552 Pain in left hip: Secondary | ICD-10-CM | POA: Diagnosis not present

## 2020-12-14 DIAGNOSIS — M25652 Stiffness of left hip, not elsewhere classified: Secondary | ICD-10-CM | POA: Diagnosis not present

## 2020-12-14 DIAGNOSIS — R2689 Other abnormalities of gait and mobility: Secondary | ICD-10-CM | POA: Diagnosis not present

## 2020-12-14 NOTE — Therapy (Signed)
[place]. [place], [name], [address] Phone: [phone]   Fax:  [phone]  Physical Therapy Treatment  Patient Details  Name: Alexis Golden MRN: [id number] Date of Birth: [birth year] Referring Provider (PT): Steele Sizer, MD   Encounter Date: 12/14/2020   PT End of Session - 12/14/20 0822     Visit Number 3    Date for PT Re-Evaluation 01/24/21    PT Start Time 0756    PT Stop Time 0821    PT Time Calculation (min) 25 min             Past Medical History:  Diagnosis Date   Fibroids    Genital herpes     Past Surgical History:  Procedure Laterality Date   MYOMECTOMY  04/2003:06/2012   WISDOM TOOTH EXTRACTION  1990's    There were no vitals filed for this visit.   Subjective Assessment - 12/14/20 0756     Subjective frustrated pain comes and goes and I don't know why                [facility] PT Assessment - 12/14/20 0001       AROM   Right/Left Knee Left    Left Knee Extension 0    Left Knee Flexion 120      Strength   Overall Strength Comments LLE grossly 4/5                           [facility] Adult PT Treatment/Exercise - 12/14/20 0001       Modalities   Modalities Iontophoresis;Ultrasound      Ultrasound   Ultrasound Location Left Pat Tendon    Ultrasound Parameters 1.2 w/cm 2 100%    Ultrasound Goals Pain;Edema      Iontophoresis   Type of Iontophoresis Dexamethasone    Location left pat tendon    Dose 1.2 cc    Time 80 ma 4 hour patch                       PT Short Term Goals - 12/10/20 1135       PT SHORT TERM GOAL #1   Title independent with initial HEP    Status Achieved               PT Long Term Goals - 12/14/20 7035       PT LONG TERM GOAL #1   Title FOTO improved to at least 76% for left knee function    Status On-going      PT LONG TERM GOAL #2   Title Independent with advanced HEP and independent gym, running  program    Status Partially Met      PT LONG TERM GOAL #3   Title LE strength increased to at least 4+/5 with </= 2/10 pain or discomfort    Status Partially Met      PT LONG TERM GOAL #4   Title Pt will report at least 50% improvement in pain with ADLs, walking, drving.    Status Partially Met                   Plan - 12/14/20 0823     Clinical Impression Statement AROM WNLs. MMT LLE 4+/5. Good patellar mvmt and alignment- no tracking issues. Tender over Left pat tendon and swollen esp compared to RT. Focus session on pain and edema  control with [place] and ionto. Progressing with goals.    PT Treatment/Interventions ADLs/Self Care Home Management;Electrical Stimulation;Cryotherapy;Iontophoresis 70m/ml Dexamethasone;Moist Heat;Functional mobility training;Therapeutic activities;Therapeutic exercise;Balance training;Neuromuscular re-education;Patient/family education;Manual techniques;Dry needling;Taping    PT Next Visit Plan assess ionto and [place] Adjust HEP and if better educ on safe return to jogging/running             Patient will benefit from skilled therapeutic intervention in order to improve the following deficits and impairments:  Abnormal gait, Decreased range of motion, Decreased balance, Pain, Impaired flexibility, Decreased strength  Visit Diagnosis: Left knee pain, unspecified chronicity     Problem List Patient Active Problem List   Diagnosis Date Noted   History of uterine fibroid 04/28/2018   Genital herpes 04/28/2018   Pre-diabetes 03/03/2017    Yuvia Plant,ANGIE, PTA 12/14/2020, 8:26 AM  [place] [address]Phone: [phone]  Fax:  [phone] Name: Alexis LehrmannMRN: [id number] of Birth: 206-Apr-1972

## 2020-12-18 ENCOUNTER — Ambulatory Visit: Payer: BC Managed Care – PPO | Admitting: Anesthesiology

## 2020-12-18 ENCOUNTER — Encounter: Payer: Self-pay | Admitting: Gastroenterology

## 2020-12-18 ENCOUNTER — Other Ambulatory Visit: Payer: Self-pay

## 2020-12-18 ENCOUNTER — Encounter
Admission: RE | Disposition: A | Discharge status: Home / Self Care | Payer: Self-pay | Source: Ambulatory Visit | Attending: Gastroenterology

## 2020-12-18 ENCOUNTER — Ambulatory Visit
Admission: RE | Admit: 2020-12-18 | Discharge: 2020-12-18 | Disposition: A | Discharge status: Home / Self Care | Payer: BC Managed Care – PPO | Source: Ambulatory Visit | Attending: Gastroenterology | Admitting: Gastroenterology

## 2020-12-18 DIAGNOSIS — Z885 Allergy status to narcotic agent status: Secondary | ICD-10-CM | POA: Diagnosis not present

## 2020-12-18 DIAGNOSIS — Z1211 Encounter for screening for malignant neoplasm of colon: Secondary | ICD-10-CM | POA: Diagnosis not present

## 2020-12-18 DIAGNOSIS — Z79899 Other long term (current) drug therapy: Secondary | ICD-10-CM | POA: Diagnosis not present

## 2020-12-18 HISTORY — PX: COLONOSCOPY WITH PROPOFOL: SHX5780

## 2020-12-18 LAB — POCT PREGNANCY, URINE: Preg Test, Ur: NEGATIVE

## 2020-12-18 SURGERY — COLONOSCOPY WITH PROPOFOL
Anesthesia: General

## 2020-12-18 MED ORDER — PROPOFOL 10 MG/ML IV BOLUS
INTRAVENOUS | Status: DC | PRN
  Administered 2020-12-18: 50 mg via INTRAVENOUS

## 2020-12-18 MED ORDER — SODIUM CHLORIDE 0.9 % IV SOLN: INTRAVENOUS | Status: DC

## 2020-12-18 MED ORDER — PROPOFOL 500 MG/50ML IV EMUL
INTRAVENOUS | Status: DC | PRN
  Administered 2020-12-18: 160 ug/kg/min via INTRAVENOUS

## 2020-12-18 MED ORDER — PROPOFOL 500 MG/50ML IV EMUL
INTRAVENOUS | Status: AC
  Filled 2020-12-18: qty 50

## 2020-12-18 NOTE — Op Note (Signed)
[facility] Patient Name: Jessika Rothery Procedure Date: 12/18/2020 9:27 AM MRN: [id number] Account #: [id number] Date of Birth: Oct 18, 1970 Admit Type: Outpatient Age: 50 Room: [facility] ENDO ROOM 2 Gender: Female Note Status: Finalized Instrument Name: Park Meo [id number] Procedure:             Colonoscopy Indications:           Screening for colorectal malignant neoplasm Providers:             Jonathon Bellows MD, MD Referring MD:          Bethena Roys. Sowles, MD (Referring MD) Medicines:             Monitored Anesthesia Care Complications:         No immediate complications. Procedure:             Pre-Anesthesia Assessment:                        - Prior to the procedure, a History and Physical was                         performed, and patient medications, allergies and                         sensitivities were reviewed. The patient's tolerance                         of previous anesthesia was reviewed.                        - The risks and benefits of the procedure and the                         sedation options and risks were discussed with the                         patient. All questions were answered and informed                         consent was obtained.                        - ASA Grade Assessment: II - A patient with mild                         systemic disease.                        After obtaining informed consent, the colonoscope was                         passed under direct vision. Throughout the procedure,                         the patient's blood pressure, pulse, and oxygen                         saturations were monitored continuously. The                         Colonoscope was  introduced through the anus and                         advanced to the the cecum, identified by the                         appendiceal orifice. The colonoscopy was performed                         with ease. The patient tolerated the procedure well.                          The quality of the bowel preparation was excellent. Findings:      The perianal and digital rectal examinations were normal.      The entire examined colon appeared normal on direct and retroflexion       views. Impression:            - The entire examined colon is normal on direct and                         retroflexion views.                        - No specimens collected. Recommendation:        - Discharge patient to home (with escort).                        - Resume previous diet.                        - Continue present medications.                        - Repeat colonoscopy in 10 years for screening                         purposes. Procedure Code(s):     --- Professional ---                        [phone], Colonoscopy, flexible; diagnostic, including                         collection of specimen(s) by brushing or washing, when                         performed (separate procedure) Diagnosis Code(s):     --- Professional ---                        Z12.11, Encounter for screening for malignant neoplasm                         of colon CPT copyright 2019 American Medical Association. All rights reserved. The codes documented in this report are preliminary and upon coder review may  be revised to meet current compliance requirements. Jonathon Bellows, MD Jonathon Bellows MD, MD 12/18/2020 9:50:05 AM This report has been signed electronically. Number of Addenda: 0 Note Initiated On: 12/18/2020 9:27 AM Scope Withdrawal Time: 0 hours 7 minutes 23 seconds  Total Procedure Duration: 0 hours  10 minutes 12 seconds  Estimated Blood Loss:  Estimated blood loss: none.      [facility]

## 2020-12-18 NOTE — Transfer of Care (Signed)
Immediate Anesthesia Transfer of Care Note  Patient: Alexis Golden  Procedure(s) Performed: COLONOSCOPY WITH PROPOFOL  Patient Location: PACU  Anesthesia Type:General  Level of Consciousness: awake and alert   Airway & Oxygen Therapy: Patient Spontanous Breathing and Patient connected to nasal cannula oxygen  Post-op Assessment: Report given to RN and Post -op Vital signs reviewed and stable  Post vital signs: Reviewed and stable  Last Vitals:  Vitals Value Taken Time  BP 95/72 12/18/20 0951  Temp    Pulse 81 12/18/20 0952  Resp 20 12/18/20 0952  SpO2 100 % 12/18/20 0952  Vitals shown include unvalidated device data.  Last Pain:  Vitals:   12/18/20 0951  TempSrc:   PainSc: 0-No pain         Complications: No notable events documented.

## 2020-12-18 NOTE — Anesthesia Preprocedure Evaluation (Signed)
Anesthesia Evaluation    Airway Mallampati: II  TM Distance: >3 FB Neck ROM: Full    Dental no notable dental hx.    Pulmonary neg pulmonary ROS,    Pulmonary exam normal        Cardiovascular Exercise Tolerance: Good negative cardio ROS Normal cardiovascular exam     Neuro/Psych negative neurological ROS     GI/Hepatic negative GI ROS, Neg liver ROS,   Endo/Other  Pre-diabetes  Renal/GU negative Renal ROS     Musculoskeletal negative musculoskeletal ROS (+)   Abdominal Normal abdominal exam  (+)   Peds  Hematology   Anesthesia Other Findings History of uterine fibroid Genital herpes    Reproductive/Obstetrics negative OB ROS                            Anesthesia Physical Anesthesia Plan  ASA: 2  Anesthesia Plan: General   Post-op Pain Management:    Induction: Intravenous  PONV Risk Score and Plan: Treatment may vary due to age or medical condition and TIVA  Airway Management Planned: Nasal Cannula and Natural Airway  Additional Equipment:   Intra-op Plan:   Post-operative Plan:   Informed Consent: I have reviewed the patients History and Physical, chart, labs and discussed the procedure including the risks, benefits and alternatives for the proposed anesthesia with the patient or authorized representative who has indicated his/her understanding and acceptance.     Dental advisory given  Plan Discussed with: CRNA and Anesthesiologist  Anesthesia Plan Comments:        Anesthesia Quick Evaluation

## 2020-12-18 NOTE — H&P (Signed)
Jonathon Bellows, MD [address] [place], [place], [name], [address] [address], [place], [place], [name], [address] Phone: [phone]  Fax: [phone]  Primary Care Physician:  Steele Sizer, MD   Pre-Procedure History & Physical: HPI:  Alexis Golden is a 50 y.o. female is here for an colonoscopy.   Past Medical History:  Diagnosis Date   Fibroids    Genital herpes     Past Surgical History:  Procedure Laterality Date   MYOMECTOMY  04/2003:06/2012   WISDOM TOOTH EXTRACTION  1990's    Prior to Admission medications   Medication Sig Start Date End Date Taking? Authorizing Provider  polyethylene glycol-electrolytes (NULYTELY) 420 g solution Prepare according to package instructions. Starting at 5:00 PM: Drink one 8 oz glass of mixture every 15 minutes until you finish half of the jug. Five hours prior to procedure, drink 8 oz glass of mixture every 15 minutes until it is all gone. Make sure you do not drink anything 4 hours prior to your procedure. 12/06/20  Yes Jonathon Bellows, MD  Cholecalciferol (VITAMIN D) 50 MCG (2000 UT) CAPS Take 1 capsule by mouth daily.    [provider]  Multiple Vitamins-Minerals (HAIR SKIN AND NAILS FORMULA PO) Take 1 tablet by mouth daily.    [provider]  valACYclovir (VALTREX) 500 MG tablet Take 1 tablet (500 mg total) by mouth 2 (two) times daily. 10/31/20   Steele Sizer, MD    Allergies as of 11/30/2020 - Review Complete 11/30/2020  Allergen Reaction Noted   Percocet [oxycodone-acetaminophen] Other (See Comments) 04/28/2018    Family History  Problem Relation Age of Onset   Hypertension Mother    Diabetes type II Mother    Hypertension Father    Hypertension Maternal Grandmother    Diabetes Mellitus II Maternal Grandmother    Hypertension Paternal Grandmother    Stroke Paternal Grandmother    COPD Paternal Grandfather    Hypertension Maternal Aunt    COPD Paternal Aunt    Breast cancer Neg Hx      Social History   Socioeconomic History   Marital status: Divorced    Spouse name: Not on file   Number of children: 0   Years of education: Not on file   Highest education level: Bachelor's degree (e.g., BA, AB, BS)  Occupational History   Occupation: Customer Service  Tobacco Use   Smoking status: Never   Smokeless tobacco: Never  Vaping Use   Vaping Use: Never used  Substance and Sexual Activity   Alcohol use: Yes    Alcohol/week: 1.0 standard drink    Types: 1 Glasses of wine per week    Comment: none last 24 hrs   Drug use: No   Sexual activity: Not Currently    Partners: Male    Birth control/protection: None  Other Topics Concern   Not on file  Social History Narrative   She was married for 6 years,  divorced since 03/2017    Tried to have children but unable.    Social Determinants of Health   Financial Resource Strain: Low Risk    Difficulty of Paying Living Expenses: Not hard at all  Food Insecurity: No Food Insecurity   Worried About Charity fundraiser in the Last Year: Never true   [place] in the Last Year: Never true  Transportation Needs: No Transportation Needs   Lack of Transportation (Medical): No   Lack of Transportation (Non-Medical): No  Physical Activity: Sufficiently Active   Days of Exercise per Week: 3 days   Minutes of Exercise per Session: 60 min  Stress: No Stress Concern Present   Feeling of Stress : Not at all  Social Connections: Moderately Integrated   Frequency of Communication with Friends and Family: More than three times a week   Frequency of Social Gatherings with Friends and Family: Three times a week   Attends Religious Services: More than 4 times per year   Active Member of Clubs or Organizations: Yes   Attends Music therapist: More than 4 times per year   Marital Status: Divorced  Human resources officer Violence: Not At Risk   Fear of Current or Ex-Partner: No   Emotionally Abused: No   Physically  Abused: No   Sexually Abused: No    Review of Systems: See HPI, otherwise negative ROS  Physical Exam: LMP 09/21/2020  General:   Alert,  pleasant and cooperative in NAD Head:  Normocephalic and atraumatic. Neck:  Supple; no masses or thyromegaly. Lungs:  Clear throughout to auscultation, normal respiratory effort.    Heart:  +S1, +S2, Regular rate and rhythm, No edema. Abdomen:  Soft, nontender and nondistended. Normal bowel sounds, without guarding, and without rebound.   Neurologic:  Alert and  oriented x4;  grossly normal neurologically.  Impression/Plan: [name] is here for an colonoscopy to be performed for Screening colonoscopy average risk   Risks, benefits, limitations, and alternatives regarding  colonoscopy have been reviewed with the patient.  Questions have been answered.  All parties agreeable.   Jonathon Bellows, MD  12/18/2020, 8:46 AM

## 2020-12-18 NOTE — Anesthesia Postprocedure Evaluation (Signed)
Anesthesia Post Note  Patient: Alexis Golden  Procedure(s) Performed: COLONOSCOPY WITH PROPOFOL  Patient location during evaluation: Endoscopy Anesthesia Type: General Level of consciousness: awake and alert Pain management: pain level controlled Vital Signs Assessment: post-procedure vital signs reviewed and stable Respiratory status: spontaneous breathing, nonlabored ventilation and respiratory function stable Cardiovascular status: blood pressure returned to baseline and stable Postop Assessment: no apparent nausea or vomiting Anesthetic complications: no   No notable events documented.   Last Vitals:  Vitals:   12/18/20 1011 12/18/20 1021  BP: 117/71 117/74  Pulse: 70 71  Resp: 18 15  Temp:    SpO2: 100% 100%    Last Pain:  Vitals:   12/18/20 1021  TempSrc:   PainSc: 0-No pain                 Iran Ouch

## 2020-12-19 ENCOUNTER — Encounter: Payer: Self-pay | Admitting: Rehabilitative and Restorative Service Providers"

## 2020-12-19 ENCOUNTER — Ambulatory Visit: Payer: BC Managed Care – PPO | Admitting: Rehabilitative and Restorative Service Providers"

## 2020-12-19 DIAGNOSIS — M25662 Stiffness of left knee, not elsewhere classified: Secondary | ICD-10-CM

## 2020-12-19 DIAGNOSIS — M25562 Pain in left knee: Secondary | ICD-10-CM | POA: Diagnosis not present

## 2020-12-19 DIAGNOSIS — M25652 Stiffness of left hip, not elsewhere classified: Secondary | ICD-10-CM | POA: Diagnosis not present

## 2020-12-19 DIAGNOSIS — M6281 Muscle weakness (generalized): Secondary | ICD-10-CM

## 2020-12-19 DIAGNOSIS — R2689 Other abnormalities of gait and mobility: Secondary | ICD-10-CM

## 2020-12-19 DIAGNOSIS — M25552 Pain in left hip: Secondary | ICD-10-CM

## 2020-12-19 NOTE — Therapy (Signed)
[place]. [place], [name], [address] Phone: [phone]   Fax:  [phone]  Physical Therapy Treatment  Patient Details  Name: Alexis Golden MRN: [id number] Date of Birth: 02-19-1971 Referring Provider (PT): Steele Sizer, MD   Encounter Date: 12/19/2020   PT End of Session - 12/19/20 0851     Visit Number 4    Date for PT Re-Evaluation 01/24/21    PT Start Time 0800    PT Stop Time 0840    PT Time Calculation (min) 40 min    Activity Tolerance Patient tolerated treatment well    Behavior During Therapy [facility] for tasks assessed/performed             Past Medical History:  Diagnosis Date   Fibroids    Genital herpes     Past Surgical History:  Procedure Laterality Date   COLONOSCOPY WITH PROPOFOL N/A 12/18/2020   Procedure: COLONOSCOPY WITH PROPOFOL;  Surgeon: Jonathon Bellows, MD;  Location: [facility] ENDOSCOPY;  Service: Gastroenterology;  Laterality: N/A;   MYOMECTOMY  04/2003:06/2012   WISDOM TOOTH EXTRACTION  1990's    There were no vitals filed for this visit.   Subjective Assessment - 12/19/20 0807     Subjective Pt reports that she was doing much better after last treatment, but on Saturday, she decided to wear wedge dress shoes to an evening function.  She has been having pain again since.    Patient Stated Goals less pain, be able to increase walking, work towards running and exercise at gym    Currently in Pain? Yes    Pain Score 3     Pain Location Knee    Pain Orientation Left    Pain Descriptors / Indicators Aching                               [facility] Adult PT Treatment/Exercise - 12/19/20 0001       Knee/Hip Exercises: Stretches   Gastroc Stretch Both;2 reps;20 seconds    Gastroc Stretch Limitations on floor bar      Knee/Hip Exercises: Aerobic   Nustep L3 x6 min      Knee/Hip Exercises: Standing   Heel Raises Both;2 sets;10 reps    Heel Raises Limitations from  floor bar      Knee/Hip Exercises: Seated   Long Arc Quad Strengthening;Left;2 sets;10 reps   with ball squeeze for VMO   Sit to Sand 20 reps;without UE support   x10 OHP with yellow wt ball, x10 CP with yellow wt ball standing on AirEx     Iontophoresis   Type of Iontophoresis Dexamethasone    Location left pat tendon    Dose 1.2 cc    Time 80 ma 4 hour patch      Manual Therapy   Manual Therapy Soft tissue mobilization;Myofascial release    Manual therapy comments In sitting    Soft tissue mobilization L lateral thigh and L patellar tendon    Myofascial Release manual trigger point release to L lateral thigh trigger points                       PT Short Term Goals - 12/10/20 1135       PT SHORT TERM GOAL #1   Title independent with initial HEP    Status Achieved  PT Long Term Goals - 12/19/20 0854       PT LONG TERM GOAL #2   Title Independent with advanced HEP and independent gym, running program    Status Partially Met      PT LONG TERM GOAL #3   Title LE strength increased to at least 4+/5 with </= 2/10 pain or discomfort    Status Partially Met                   Plan - 12/19/20 0851     Clinical Impression Statement Pt reports feeling much better following [place] and iontopatch last session.  With palpation, noted muscle tightness and trigger points alow lateral L thigh.  Pt reported following manual therapy, she felt much better.  Pt tolerated session well and did not complain of increased pain with ther ex and provided with red and green theraband loop for HEP.  She verbalizes her undersanding not to wear heels or wedge shoes at this time to avoid another flare.    PT Treatment/Interventions ADLs/Self Care Home Management;Electrical Stimulation;Cryotherapy;Iontophoresis 41m/ml Dexamethasone;Moist Heat;Functional mobility training;Therapeutic activities;Therapeutic exercise;Balance training;Neuromuscular  re-education;Patient/family education;Manual techniques;Dry needling;Taping    PT Next Visit Plan assess ionto. Adjust HEP and if better educ on safe return to jogging/running    Consulted and Agree with Plan of Care Patient             Patient will benefit from skilled therapeutic intervention in order to improve the following deficits and impairments:  Abnormal gait, Decreased range of motion, Decreased balance, Pain, Impaired flexibility, Decreased strength  Visit Diagnosis: Left knee pain, unspecified chronicity  Stiffness of left knee, not elsewhere classified  Pain in left hip  Stiffness of left hip, not elsewhere classified  Muscle weakness (generalized)  Other abnormalities of gait and mobility     Problem List Patient Active Problem List   Diagnosis Date Noted   History of uterine fibroid 04/28/2018   Genital herpes 04/28/2018   Pre-diabetes 03/03/2017    SJuel Burrow PT, DPT 12/19/2020, 8:57 AM  [place] [address]Phone: [phone]  Fax:  [phone] Name: TTiffeny MinchewMRN: [id number] of Birth: [birth year]

## 2020-12-21 DIAGNOSIS — L448 Other specified papulosquamous disorders: Secondary | ICD-10-CM | POA: Diagnosis not present

## 2020-12-24 ENCOUNTER — Other Ambulatory Visit: Payer: Self-pay

## 2020-12-24 ENCOUNTER — Ambulatory Visit: Payer: BC Managed Care – PPO | Attending: Family Medicine | Admitting: Rehabilitative and Restorative Service Providers"

## 2020-12-24 ENCOUNTER — Encounter: Payer: Self-pay | Admitting: Rehabilitative and Restorative Service Providers"

## 2020-12-24 ENCOUNTER — Ambulatory Visit: Payer: BC Managed Care – PPO | Admitting: Physical Therapy

## 2020-12-24 DIAGNOSIS — M25662 Stiffness of left knee, not elsewhere classified: Secondary | ICD-10-CM | POA: Insufficient documentation

## 2020-12-24 DIAGNOSIS — M25562 Pain in left knee: Secondary | ICD-10-CM | POA: Insufficient documentation

## 2020-12-24 DIAGNOSIS — R2689 Other abnormalities of gait and mobility: Secondary | ICD-10-CM | POA: Diagnosis not present

## 2020-12-24 DIAGNOSIS — M25552 Pain in left hip: Secondary | ICD-10-CM | POA: Insufficient documentation

## 2020-12-24 DIAGNOSIS — M25652 Stiffness of left hip, not elsewhere classified: Secondary | ICD-10-CM | POA: Diagnosis not present

## 2020-12-24 DIAGNOSIS — M6281 Muscle weakness (generalized): Secondary | ICD-10-CM | POA: Insufficient documentation

## 2020-12-24 NOTE — Therapy (Signed)
[place]. [place], [name], [address] Phone: [phone]   Fax:  [phone]  Physical Therapy Treatment  Patient Details  Name: Alexis Golden MRN: [id number] Date of Birth: 03-01-1971 Referring Provider (PT): Steele Sizer, MD   Encounter Date: 12/24/2020   PT End of Session - 12/24/20 0804     Visit Number 5    Date for PT Re-Evaluation 01/24/21    PT Start Time 0800    PT Stop Time 0840    PT Time Calculation (min) 40 min    Activity Tolerance Patient tolerated treatment well    Behavior During Therapy [facility] for tasks assessed/performed             Past Medical History:  Diagnosis Date   Fibroids    Genital herpes     Past Surgical History:  Procedure Laterality Date   COLONOSCOPY WITH PROPOFOL N/A 12/18/2020   Procedure: COLONOSCOPY WITH PROPOFOL;  Surgeon: Jonathon Bellows, MD;  Location: [facility] ENDOSCOPY;  Service: Gastroenterology;  Laterality: N/A;   MYOMECTOMY  04/2003:06/2012   WISDOM TOOTH EXTRACTION  1990's    There were no vitals filed for this visit.   Subjective Assessment - 12/24/20 0802     Subjective I am having pain coming off an on, but I wore my sneakers all weekend.    Patient Stated Goals less pain, be able to increase walking, work towards running and exercise at gym    Currently in Pain? Yes    Pain Score 3     Pain Location Knee    Pain Orientation Left                               [facility] Adult PT Treatment/Exercise - 12/24/20 0001       Knee/Hip Exercises: Stretches   Gastroc Stretch Both;2 reps;20 seconds    Gastroc Stretch Limitations on floor bar      Knee/Hip Exercises: Aerobic   Recumbent Bike L2.0 x6 min      Knee/Hip Exercises: Machines for Strengthening   Cybex Knee Extension 15# 2x10    Cybex Knee Flexion 25# 2x10      Knee/Hip Exercises: Standing   Heel Raises Both;2 sets;10 reps    Heel Raises Limitations from floor bar with prancing  alt LE motion    Other Standing Knee Exercises Side stepping with red theraband each way x3 laps      Knee/Hip Exercises: Seated   Long Arc Quad Strengthening;Left;2 sets;10 reps    Long Arc Quad Weight 3 lbs.    Long [employer] Limitations with ball squeeze for VMO    Sit to [employer] 20 reps;without UE support   x10 OHP with yellow wt ball, x10 CP with yellow wt ball standing on AirEx     Modalities   Modalities Iontophoresis      Iontophoresis   Type of Iontophoresis Dexamethasone    Location left pat tendon    Dose 1.2 cc    Time 80 ma 4 hour patch                       PT Short Term Goals - 12/10/20 1135       PT SHORT TERM GOAL #1   Title independent with initial HEP    Status Achieved               PT Long  Term Goals - 12/24/20 0847       PT LONG TERM GOAL #1   Title FOTO improved to at least 76% for left knee function    Status On-going      PT LONG TERM GOAL #2   Title Independent with advanced HEP and independent gym, running program    Status Partially Met      PT LONG TERM GOAL #3   Title LE strength increased to at least 4+/5 with </= 2/10 pain or discomfort    Status Partially Met      PT LONG TERM GOAL #4   Title Pt will report at least 50% improvement in pain with ADLs, walking, drving.    Status Partially Met                   Plan - 12/24/20 0843     Clinical Impression Statement Ms Hunnicutt is continuing to report feeling better and stating that the iontopatch is helping a lot.  She is less tender to palpation along patellar tendon and L lateral thigh than last session.  Pt reports that she has been wearing sneakers and was able to walk over unlevel surfaces over the weekend without increased pain.  She was able to tolerate addition of weight machines again and did not have any increased pain.  She reports some muscle fatigue following side stepping with red theraband.    PT Treatment/Interventions ADLs/Self Care Home  Management;Electrical Stimulation;Cryotherapy;Iontophoresis 36m/ml Dexamethasone;Moist Heat;Functional mobility training;Therapeutic activities;Therapeutic exercise;Balance training;Neuromuscular re-education;Patient/family education;Manual techniques;Dry needling;Taping    PT Next Visit Plan assess ionto. Adjust HEP and if better educ on safe return to jogging/running    Consulted and Agree with Plan of Care Patient             Patient will benefit from skilled therapeutic intervention in order to improve the following deficits and impairments:  Abnormal gait, Decreased range of motion, Decreased balance, Pain, Impaired flexibility, Decreased strength  Visit Diagnosis: Left knee pain, unspecified chronicity  Stiffness of left knee, not elsewhere classified  Pain in left hip  Stiffness of left hip, not elsewhere classified  Muscle weakness (generalized)  Other abnormalities of gait and mobility     Problem List Patient Active Problem List   Diagnosis Date Noted   History of uterine fibroid 04/28/2018   Genital herpes 04/28/2018   Pre-diabetes 03/03/2017    SJuel Burrow PT, DPT 12/24/2020, 8:49 AM  [place] [address]Phone: [phone]  Fax:  [phone] Name: Alexis TuohyMRN: [id number] of Birth: 210-02-1971

## 2020-12-26 ENCOUNTER — Ambulatory Visit: Payer: BC Managed Care – PPO | Admitting: Physical Therapy

## 2020-12-27 ENCOUNTER — Ambulatory Visit: Payer: BC Managed Care – PPO | Admitting: Physical Therapy

## 2020-12-27 ENCOUNTER — Other Ambulatory Visit: Payer: Self-pay

## 2020-12-27 DIAGNOSIS — M25662 Stiffness of left knee, not elsewhere classified: Secondary | ICD-10-CM | POA: Diagnosis not present

## 2020-12-27 DIAGNOSIS — M6281 Muscle weakness (generalized): Secondary | ICD-10-CM | POA: Diagnosis not present

## 2020-12-27 DIAGNOSIS — M25562 Pain in left knee: Secondary | ICD-10-CM

## 2020-12-27 DIAGNOSIS — M25652 Stiffness of left hip, not elsewhere classified: Secondary | ICD-10-CM | POA: Diagnosis not present

## 2020-12-27 DIAGNOSIS — M25552 Pain in left hip: Secondary | ICD-10-CM

## 2020-12-27 DIAGNOSIS — R2689 Other abnormalities of gait and mobility: Secondary | ICD-10-CM | POA: Diagnosis not present

## 2020-12-27 NOTE — Therapy (Signed)
[place]. [place], [name], [address] Phone: [phone]   Fax:  [phone]  Physical Therapy Treatment  Patient Details  Name: Alexis Golden MRN: [id number] Date of Birth: 1971-01-09 Referring Provider (PT): Steele Sizer, MD   Encounter Date: 12/27/2020   PT End of Session - 12/27/20 0836     Visit Number 6    Date for PT Re-Evaluation 01/24/21    PT Start Time 0800    PT Stop Time 0845    PT Time Calculation (min) 45 min             Past Medical History:  Diagnosis Date   Fibroids    Genital herpes     Past Surgical History:  Procedure Laterality Date   COLONOSCOPY WITH PROPOFOL N/A 12/18/2020   Procedure: COLONOSCOPY WITH PROPOFOL;  Surgeon: Jonathon Bellows, MD;  Location: [facility] ENDOSCOPY;  Service: Gastroenterology;  Laterality: N/A;   MYOMECTOMY  04/2003:06/2012   WISDOM TOOTH EXTRACTION  1990's    There were no vitals filed for this visit.   Subjective Assessment - 12/27/20 0758     Subjective better, pain is with pivoting    Currently in Pain? No/denies                               [facility] Adult PT Treatment/Exercise - 12/27/20 0001       Knee/Hip Exercises: Aerobic   Elliptical L 3 3 min fwd,2 min backward    Tread Mill 1 mph 2 min each side - slight pain with Left push off   Light Jog 85mh 5 min- no pain in knee slight in left hip     Knee/Hip Exercises: Standing   Lateral Step Up Left;15 reps   BOSU   Forward Step Up Left;15 reps   on bosu with RT hip flexion   SLS with Vectors on airex    Other Standing Knee Exercises BIL hip cable pulleys 4 way 15 each   slight knee pian with SLS on left     Iontophoresis   Type of Iontophoresis Dexamethasone    Location left pat tendon    Dose 1.2 cc    Time 80 ma 4 hour patch                       PT Short Term Goals - 12/10/20 1135       PT SHORT TERM GOAL #1   Title independent with initial HEP     Status Achieved               PT Long Term Goals - 12/27/20 0825       PT LONG TERM GOAL #1   Title FOTO improved to at least 76% for left knee function    Status On-going      PT LONG TERM GOAL #2   Title Independent with advanced HEP and independent gym, running program    Status Partially Met      PT LONG TERM GOAL #3   Title LE strength increased to at least 4+/5 with </= 2/10 pain or discomfort    Baseline strength met, pain level varies    Status Partially Met      PT LONG TERM GOAL #4   Title Pt will report at least 50% improvement in pain with ADLs, walking, drving.    Status Partially Met  Plan - 12/27/20 0836     Clinical Impression Statement progressing with goals. initiated running today and increased SLS work as pivoting caused pain per pt. some increased pain with TM SW with left foot push off and some hip and knee pain with SLS. overall pt is doing very well so we discussed starting run/walk gradually    PT Treatment/Interventions ADLs/Self Care Home Management;Electrical Stimulation;Cryotherapy;Iontophoresis 46m/ml Dexamethasone;Moist Heat;Functional mobility training;Therapeutic activities;Therapeutic exercise;Balance training;Neuromuscular re-education;Patient/family education;Manual techniques;Dry needling;Taping    PT Next Visit Plan assess run/walk program and how its going. asses hip tightness             Patient will benefit from skilled therapeutic intervention in order to improve the following deficits and impairments:  Abnormal gait, Decreased range of motion, Decreased balance, Pain, Impaired flexibility, Decreased strength  Visit Diagnosis: Left knee pain, unspecified chronicity  Pain in left hip  Muscle weakness (generalized)     Problem List Patient Active Problem List   Diagnosis Date Noted   History of uterine fibroid 04/28/2018   Genital herpes 04/28/2018   Pre-diabetes 03/03/2017     Alok Minshall,ANGIE, PTA 12/27/2020, 8:39 AM  [place] [address]Phone: [phone]  Fax:  [phone] Name: TSamanvi CucciaMRN: [id number] of Birth: 2Feb 19, 1972

## 2020-12-31 ENCOUNTER — Ambulatory Visit: Payer: BC Managed Care – PPO | Admitting: Physical Therapy

## 2021-01-02 ENCOUNTER — Ambulatory Visit: Payer: BC Managed Care – PPO | Admitting: Physical Therapy

## 2021-01-07 ENCOUNTER — Ambulatory Visit: Payer: BC Managed Care – PPO | Admitting: Physical Therapy

## 2021-01-08 ENCOUNTER — Ambulatory Visit: Payer: BC Managed Care – PPO | Admitting: Physical Therapy

## 2021-01-09 ENCOUNTER — Ambulatory Visit: Payer: BC Managed Care – PPO | Admitting: Physical Therapy

## 2021-01-10 ENCOUNTER — Ambulatory Visit: Payer: BC Managed Care – PPO | Admitting: Physical Therapy

## 2021-01-10 ENCOUNTER — Other Ambulatory Visit: Payer: Self-pay

## 2021-01-10 ENCOUNTER — Encounter: Payer: Self-pay | Admitting: Physical Therapy

## 2021-01-10 DIAGNOSIS — M25552 Pain in left hip: Secondary | ICD-10-CM

## 2021-01-10 DIAGNOSIS — M25652 Stiffness of left hip, not elsewhere classified: Secondary | ICD-10-CM | POA: Diagnosis not present

## 2021-01-10 DIAGNOSIS — R2689 Other abnormalities of gait and mobility: Secondary | ICD-10-CM | POA: Diagnosis not present

## 2021-01-10 DIAGNOSIS — M25562 Pain in left knee: Secondary | ICD-10-CM | POA: Diagnosis not present

## 2021-01-10 DIAGNOSIS — M6281 Muscle weakness (generalized): Secondary | ICD-10-CM | POA: Diagnosis not present

## 2021-01-10 DIAGNOSIS — M25662 Stiffness of left knee, not elsewhere classified: Secondary | ICD-10-CM | POA: Diagnosis not present

## 2021-01-10 NOTE — Therapy (Signed)
[name], [name], [address] Phone: [phone]   Fax:  [phone]  Physical Therapy Treatment  Patient Details  Name: Alexis Golden MRN: [id number] Date of Birth: 07-11-1970 Referring Provider (PT): Alexis Sizer, MD   Encounter Date: 01/10/2021   PT End of Session - 01/10/21 0844     Visit Number 7    Date for PT Re-Evaluation 01/24/21    PT Start Time 0800    PT Stop Time 0844    PT Time Calculation (min) 44 min    Activity Tolerance Patient tolerated treatment well    Behavior During Therapy [facility] for tasks assessed/performed             Past Medical History:  Diagnosis Date   Fibroids    Genital herpes     Past Surgical History:  Procedure Laterality Date   COLONOSCOPY WITH PROPOFOL N/A 12/18/2020   Procedure: COLONOSCOPY WITH PROPOFOL;  Surgeon: Jonathon Bellows, MD;  Location: [facility] ENDOSCOPY;  Service: Gastroenterology;  Laterality: N/A;   MYOMECTOMY  04/2003:06/2012   WISDOM TOOTH EXTRACTION  1990's    There were no vitals filed for this visit.   Subjective Assessment - 01/10/21 0802     Subjective Good, been jogging some, stops and walks when she has pain    Currently in Pain? Yes    Pain Score 3     Pain Location Knee    Pain Orientation Left                               [facility] Adult PT Treatment/Exercise - 01/10/21 0001       High Level Balance   High Level Balance Comments alt cone taps on aiex x10      Knee/Hip Exercises: Aerobic   Elliptical L2.6  4 min fwd,2 min backward      Knee/Hip Exercises: Machines for Strengthening   Cybex Knee Extension 15# 2x10    Cybex Knee Flexion 25# 2x10    Cybex Leg Press 40lb 3x10      Knee/Hip Exercises: Standing   Step Down Left;2 sets;10 reps;Hand Hold: 0;Step Height: 4"   eccentrics   Walking with Sports Cord 40lb side steps x5 each    Other Standing Knee Exercises SL DL 3lb 3x5 LLE      Knee/Hip  Exercises: Seated   Sit to Sand 2 sets;10 reps;without UE support   on airex                      PT Short Term Goals - 12/10/20 1135       PT SHORT TERM GOAL #1   Title independent with initial HEP    Status Achieved               PT Long Term Goals - 12/27/20 0825       PT LONG TERM GOAL #1   Title FOTO improved to at least 76% for left knee function    Status On-going      PT LONG TERM GOAL #2   Title Independent with advanced HEP and independent gym, running program    Status Partially Met      PT LONG TERM GOAL #3   Title LE strength increased to at least 4+/5 with </= 2/10 pain or discomfort    Baseline strength met, pain level varies    Status Partially Met  PT LONG TERM GOAL #4   Title Pt will report at least 50% improvement in pain with ADLs, walking, drving.    Status Partially Met                   Plan - 01/10/21 0845     Clinical Impression Statement Pt continues to do well. She reports that she has returned to jogging on flat surfaces only. Sone increase in L hip/knee discomfort with eccentric lateral step downs and single leg dead lifts. Some instability noted with alt cone taps on airex.    Stability/Clinical Decision Making Stable/Uncomplicated    Rehab Potential Good    PT Frequency 2x / week    PT Duration 8 weeks    PT Treatment/Interventions ADLs/Self Care Home Management;Electrical Stimulation;Cryotherapy;Iontophoresis 4mg /ml Dexamethasone;Moist Heat;Functional mobility training;Therapeutic activities;Therapeutic exercise;Balance training;Neuromuscular re-education;Patient/family education;Manual techniques;Dry needling;Taping    PT Next Visit Plan assess run/walk program and how its going. asses hip tightness             Patient will benefit from skilled therapeutic intervention in order to improve the following deficits and impairments:  Abnormal gait, Decreased range of motion, Decreased balance, Pain,  Impaired flexibility, Decreased strength  Visit Diagnosis: Left knee pain, unspecified chronicity  Pain in left hip  Muscle weakness (generalized)  Stiffness of left knee, not elsewhere classified  Stiffness of left hip, not elsewhere classified     Problem List Patient Active Problem List   Diagnosis Date Noted   History of uterine fibroid 04/28/2018   Genital herpes 04/28/2018   Pre-diabetes 03/03/2017    [name], PTA 01/10/2021, 8:48 AM  [place]. [place], [name], [address] Phone: [phone]   Fax:  [phone]  Name: Alexis Golden MRN: [id number] Date of Birth: Dec 23, 1970

## 2021-01-14 ENCOUNTER — Ambulatory Visit: Payer: BC Managed Care – PPO | Admitting: Physical Therapy

## 2021-01-15 ENCOUNTER — Other Ambulatory Visit: Payer: Self-pay

## 2021-01-15 ENCOUNTER — Ambulatory Visit: Payer: BC Managed Care – PPO | Admitting: Physical Therapy

## 2021-01-15 DIAGNOSIS — M6281 Muscle weakness (generalized): Secondary | ICD-10-CM | POA: Diagnosis not present

## 2021-01-15 DIAGNOSIS — M25662 Stiffness of left knee, not elsewhere classified: Secondary | ICD-10-CM

## 2021-01-15 DIAGNOSIS — M25562 Pain in left knee: Secondary | ICD-10-CM | POA: Diagnosis not present

## 2021-01-15 DIAGNOSIS — M25652 Stiffness of left hip, not elsewhere classified: Secondary | ICD-10-CM | POA: Diagnosis not present

## 2021-01-15 DIAGNOSIS — M25552 Pain in left hip: Secondary | ICD-10-CM

## 2021-01-15 DIAGNOSIS — R2689 Other abnormalities of gait and mobility: Secondary | ICD-10-CM | POA: Diagnosis not present

## 2021-01-15 NOTE — Therapy (Signed)
[place]. [place], [name], [address] Phone: [phone]   Fax:  [phone]  Physical Therapy Treatment  Patient Details  Name: Alexis Golden MRN: [id number] Date of Birth: 04-Sep-1970 Referring Provider (PT): Steele Sizer, MD   Encounter Date: 01/15/2021   PT End of Session - 01/15/21 0756     Visit Number 8    Date for PT Re-Evaluation 01/24/21    PT Start Time 0800    PT Stop Time 0845    PT Time Calculation (min) 45 min    Activity Tolerance Patient tolerated treatment well    Behavior During Therapy [facility] for tasks assessed/performed             Past Medical History:  Diagnosis Date   Fibroids    Genital herpes     Past Surgical History:  Procedure Laterality Date   COLONOSCOPY WITH PROPOFOL N/A 12/18/2020   Procedure: COLONOSCOPY WITH PROPOFOL;  Surgeon: Jonathon Bellows, MD;  Location: [facility] ENDOSCOPY;  Service: Gastroenterology;  Laterality: N/A;   MYOMECTOMY  04/2003:06/2012   WISDOM TOOTH EXTRACTION  1990's    There were no vitals filed for this visit.   Subjective Assessment - 01/15/21 0804     Subjective Pt notes she has continued to run but stops when there's pain (able to get around flat portion of [place]). Pt notes she walked and stood a lot this weekend which did make her knee ache but the pain wasn't so bad she couldn't do anything else for the rest of her weekend.    Diagnostic tests no imaging and has not seen ortho    Patient Stated Goals less pain, be able to increase walking, work towards running and exercise at gym    Currently in Pain? Yes    Pain Score 4     Pain Location Knee    Pain Orientation Left    Pain Descriptors / Indicators Aching    Pain Type Acute pain                               [facility] Adult PT Treatment/Exercise - 01/15/21 0001       Knee/Hip Exercises: Stretches   Active Hamstring Stretch Right;Left;30 seconds    Quad Stretch  30 seconds;Right;Left    Gastroc Stretch 30 seconds;Right;Left    Soleus Stretch Right;Left;30 seconds      Knee/Hip Exercises: Aerobic   Elliptical L1 5 min forward      Knee/Hip Exercises: Machines for Strengthening   Cybex Leg Press DL: 60# x10; L SL eccentrics 40# 2x10      Knee/Hip Exercises: Plyometrics   Other Plyometric Exercises double leg hop in place x10; double leg hop forward/backward x10; lateral hops x10      Knee/Hip Exercises: Standing   Heel Raises Left;2 sets;10 reps   single leg   Lateral Step Up Left;20 reps;Hand Hold: 1;Step Height: 4"      Knee/Hip Exercises: Seated   Sit to Sand without UE support;10 reps   staggered stance with L LE back     Iontophoresis   Type of Iontophoresis Dexamethasone    Location left pat tendon    Dose 1.2 cc    Time 80 ma 4 hour patch                       PT Short Term Goals - 12/10/20  [place] #1   Title independent with initial HEP    Status Achieved               PT Long Term Goals - 12/27/20 0825       PT LONG TERM GOAL #1   Title FOTO improved to at least 76% for left knee function    Status On-going      PT LONG TERM GOAL #2   Title Independent with advanced HEP and independent gym, running program    Status Partially Met      PT LONG TERM GOAL #3   Title LE strength increased to at least 4+/5 with </= 2/10 pain or discomfort    Baseline strength met, pain level varies    Status Partially Met      PT LONG TERM GOAL #4   Title Pt will report at least 50% improvement in pain with ADLs, walking, drving.    Status Partially Met                   Plan - 01/15/21 0807     Clinical Impression Statement Treatment focused on continuing to work on eccentric quad strengthening, hip/glute strengthening, and initiation of plyometrics to improve pain with running. Able to tolerate running on treadmill ~2 min before feeling pain.    Stability/Clinical Decision  Making Stable/Uncomplicated    Rehab Potential Good    PT Frequency 2x / week    PT Duration 8 weeks    PT Treatment/Interventions ADLs/Self Care Home Management;Electrical Stimulation;Cryotherapy;Iontophoresis 58m/ml Dexamethasone;Moist Heat;Functional mobility training;Therapeutic activities;Therapeutic exercise;Balance training;Neuromuscular re-education;Patient/family education;Manual techniques;Dry needling;Taping    PT Next Visit Plan Continue hip and eccentric knee strengthening. Continue to work on plyometrics for improved return to run    [place]Access Code: [email or web address] Agree with Plan of Care Patient             Patient will benefit from skilled therapeutic intervention in order to improve the following deficits and impairments:  Abnormal gait, Decreased range of motion, Decreased balance, Pain, Impaired flexibility, Decreased strength  Visit Diagnosis: Left knee pain, unspecified chronicity  Pain in left hip  Muscle weakness (generalized)  Stiffness of left knee, not elsewhere classified  Stiffness of left hip, not elsewhere classified  Other abnormalities of gait and mobility     Problem List Patient Active Problem List   Diagnosis Date Noted   History of uterine fibroid 04/28/2018   Genital herpes 04/28/2018   Pre-diabetes 03/03/2017    Alexis Golden April MGordy Levan PT, DPT 01/15/2021, 8:51 AM  [place] [address]Phone: [phone]  Fax:  [phone] Name: Alexis LeiMRN: [id number] of Birth: 2Jul 27, 1972

## 2021-01-15 NOTE — Patient Instructions (Signed)
Access Code: [email or web address] URL: [email or web address] Date: 01/15/2021 Prepared by: Estill Bamberg April Thurnell Garbe  Exercises Staggered Sit-to-Stand - 1 x daily - 7 x weekly - 3 sets - 10 reps Lateral Step Down - 1 x daily - 7 x weekly - 3 sets - 10 reps Standing Quadriceps Stretch - 1 x daily - 7 x weekly - 2 sets - 30 sec hold Gastroc Stretch on Wall - 1 x daily - 7 x weekly - 2 sets - 30 sec hold Standing Hamstring Stretch with Step - 1 x daily - 7 x weekly - 2 sets - 30 sec hold

## 2021-01-16 ENCOUNTER — Ambulatory Visit: Payer: BC Managed Care – PPO | Admitting: Physical Therapy

## 2021-01-17 ENCOUNTER — Other Ambulatory Visit: Payer: Self-pay

## 2021-01-17 ENCOUNTER — Encounter: Payer: Self-pay | Admitting: Physical Therapy

## 2021-01-17 ENCOUNTER — Ambulatory Visit: Payer: BC Managed Care – PPO | Admitting: Physical Therapy

## 2021-01-17 DIAGNOSIS — M25552 Pain in left hip: Secondary | ICD-10-CM | POA: Diagnosis not present

## 2021-01-17 DIAGNOSIS — M25562 Pain in left knee: Secondary | ICD-10-CM

## 2021-01-17 DIAGNOSIS — M25652 Stiffness of left hip, not elsewhere classified: Secondary | ICD-10-CM | POA: Diagnosis not present

## 2021-01-17 DIAGNOSIS — R2689 Other abnormalities of gait and mobility: Secondary | ICD-10-CM | POA: Diagnosis not present

## 2021-01-17 DIAGNOSIS — M25662 Stiffness of left knee, not elsewhere classified: Secondary | ICD-10-CM

## 2021-01-17 DIAGNOSIS — M6281 Muscle weakness (generalized): Secondary | ICD-10-CM | POA: Diagnosis not present

## 2021-01-17 NOTE — Therapy (Signed)
[place]. [place], [name], [address] Phone: [phone]   Fax:  [phone]  Physical Therapy Treatment  Patient Details  Name: Alexis Golden MRN: [id number] Date of Birth: July 21, 1970 Referring Provider (PT): Steele Sizer, MD   Encounter Date: 01/17/2021   PT End of Session - 01/17/21 0755     Visit Number 9    Date for PT Re-Evaluation 01/24/21    PT Start Time 0758    PT Stop Time 0839    PT Time Calculation (min) 41 min    Activity Tolerance Patient tolerated treatment well    Behavior During Therapy [facility] for tasks assessed/performed             Past Medical History:  Diagnosis Date   Fibroids    Genital herpes     Past Surgical History:  Procedure Laterality Date   COLONOSCOPY WITH PROPOFOL N/A 12/18/2020   Procedure: COLONOSCOPY WITH PROPOFOL;  Surgeon: Jonathon Bellows, MD;  Location: [facility] ENDOSCOPY;  Service: Gastroenterology;  Laterality: N/A;   MYOMECTOMY  04/2003:06/2012   WISDOM TOOTH EXTRACTION  1990's    There were no vitals filed for this visit.   Subjective Assessment - 01/17/21 0759     Subjective Pt states her knee is hurting a little this morning, not sure why.    Patient Stated Goals less pain, be able to increase walking, work towards running and exercise at gym    Currently in Pain? Yes    Pain Score 3                                [facility] Adult PT Treatment/Exercise - 01/17/21 0001       Knee/Hip Exercises: Aerobic   Elliptical L2 x 5 in 2.[address] e[address]      Knee/Hip Exercises: Machines for Strengthening   Cybex Knee Extension 15# 2x10 up with both, down with    Cybex Knee Flexion 35# 2x10    Cybex Leg Press DL: 60# x10; L SL eccentrics 40# 2x10      Knee/Hip Exercises: Plyometrics   Other Plyometric Exercises double leg hop in place x10; double leg hop forward/backward x10; SL hops x 10 fwd; box hops x 5; lateral single leg hops x10      Knee/Hip  Exercises: Standing   Heel Raises Left;2 sets;10 reps   single leg   Heel Raises Limitations 10# ea hand    Step Down Left;20 reps;Hand Hold: 1;Step Height: 4";Step Height: 6"      Knee/Hip Exercises: Seated   Sit to Sand without UE support;10 reps   staggered stance with L LE back 10# OH press     Iontophoresis   Type of Iontophoresis Dexamethasone    Location left pat tendon    Dose 1.2 cc    Time 80 ma 4 hour patch                       PT Short Term Goals - 12/10/20 1135       PT SHORT TERM GOAL #1   Title independent with initial HEP    Status Achieved               PT Long Term Goals - 01/17/21 0804       PT LONG TERM GOAL #4   Title Pt will report at least 50% improvement in pain with  ADLs, walking, drving.    Baseline able to jog now intermittently    Status Achieved                   Plan - 01/17/21 0841     Clinical Impression Statement Pt tolerated all TE without pain and pain improved by end of session. She experiences pain with twisting so may benefit from rotational strengthening to get her in the habit of pivoting on her foot. She reports over 50% improvement overall and is able to jog where she could not previously.    PT Treatment/Interventions ADLs/Self Care Home Management;Electrical Stimulation;Cryotherapy;Iontophoresis 4mg /ml Dexamethasone;Moist Heat;Functional mobility training;Therapeutic activities;Therapeutic exercise;Balance training;Neuromuscular re-education;Patient/family education;Manual techniques;Dry needling;Taping    PT Next Visit Plan Continue hip and eccentric knee strengthening. Continue to work on plyometrics for improved return to run; add pivot activities    PT Home Exercise Plan Access Code: [email or web address]             Patient will benefit from skilled therapeutic intervention in order to improve the following deficits and impairments:  Abnormal gait, Decreased range of motion, Decreased balance, Pain,  Impaired flexibility, Decreased strength  Visit Diagnosis: Left knee pain, unspecified chronicity  Muscle weakness (generalized)  Stiffness of left knee, not elsewhere classified     Problem List Patient Active Problem List   Diagnosis Date Noted   History of uterine fibroid 04/28/2018   Genital herpes 04/28/2018   Pre-diabetes 03/03/2017    Madelyn Flavors PT 01/17/2021, 8:44 AM  [place]. [place], [name], [address] Phone: [phone]   Fax:  [phone]  Name: Alexis Golden MRN: [id number] Date of Birth: 04-11-1970

## 2021-01-24 ENCOUNTER — Other Ambulatory Visit: Payer: Self-pay

## 2021-01-24 ENCOUNTER — Ambulatory Visit: Payer: BC Managed Care – PPO | Attending: Family Medicine | Admitting: Physical Therapy

## 2021-01-24 ENCOUNTER — Encounter: Payer: Self-pay | Admitting: Physical Therapy

## 2021-01-24 DIAGNOSIS — M25652 Stiffness of left hip, not elsewhere classified: Secondary | ICD-10-CM | POA: Insufficient documentation

## 2021-01-24 DIAGNOSIS — M25562 Pain in left knee: Secondary | ICD-10-CM | POA: Insufficient documentation

## 2021-01-24 DIAGNOSIS — M25552 Pain in left hip: Secondary | ICD-10-CM | POA: Insufficient documentation

## 2021-01-24 DIAGNOSIS — M6281 Muscle weakness (generalized): Secondary | ICD-10-CM | POA: Insufficient documentation

## 2021-01-24 DIAGNOSIS — M25662 Stiffness of left knee, not elsewhere classified: Secondary | ICD-10-CM | POA: Insufficient documentation

## 2021-01-24 DIAGNOSIS — R2689 Other abnormalities of gait and mobility: Secondary | ICD-10-CM | POA: Diagnosis not present

## 2021-01-24 NOTE — Patient Instructions (Signed)
Access Code: [email or web address] URL: [email or web address] Date: 01/24/2021 Prepared by: Ethel Rana  Exercises Standard Plank - 1 x daily - 7 x weekly - 3 reps - 10 hold Side Plank on Elbow - 1 x daily - 7 x weekly - 3 reps - 10 hold Side Plank on Knees - 1 x daily - 7 x weekly - 3 reps - 10 hold Bridge with Arms at [employer] and Feet on [employer] - 1 x daily - 7 x weekly - 2 sets - 10 reps Bridge with Hamstring Curl on [employer] - 1 x daily - 7 x weekly - 2 sets - 10 reps Single Leg Bridge on [employer] - 1 x daily - 7 x weekly - 2 sets - 10 reps

## 2021-01-24 NOTE — Therapy (Signed)
[place]. [place], [name], [address] Phone: [phone]   Fax:  [phone]  Physical Therapy Treatment  Patient Details  Name: Alexis Golden MRN: [id number] Date of Birth: 1970-11-20 Referring Provider (PT): Steele Sizer, MD   Encounter Date: 01/24/2021   PT End of Session - 01/24/21 1638     Visit Number 10    Date for PT Re-Evaluation 01/24/21    PT Start Time 0759    PT Stop Time 0845    PT Time Calculation (min) 46 min    Activity Tolerance Patient tolerated treatment well    Behavior During Therapy [facility] for tasks assessed/performed             Past Medical History:  Diagnosis Date   Fibroids    Genital herpes     Past Surgical History:  Procedure Laterality Date   COLONOSCOPY WITH PROPOFOL N/A 12/18/2020   Procedure: COLONOSCOPY WITH PROPOFOL;  Surgeon: Jonathon Bellows, MD;  Location: [facility] ENDOSCOPY;  Service: Gastroenterology;  Laterality: N/A;   MYOMECTOMY  04/2003:06/2012   WISDOM TOOTH EXTRACTION  1990's    There were no vitals filed for this visit.   Subjective Assessment - 01/24/21 0811     Subjective Patient reports no pain this morning. Improving overall. She has been very busy and has performed HEP, but not consistently.    Diagnostic tests no imaging and has not seen ortho    Patient Stated Goals less pain, be able to increase walking, work towards running and exercise at gym    Currently in Pain? No/denies                               [facility] Adult PT Treatment/Exercise - 01/24/21 0001       Knee/Hip Exercises: Aerobic   Other Aerobic elliptical 3 min forward, 3 back, 2.0 resistance.      Knee/Hip Exercises: Supine   Bridges Strengthening;Both;2 sets;10 reps    Bridges Limitations over therapeutic ball. Also performed with IR to engage add, 2 x 10.    Other Supine Knee/Hip Exercises Supine ball roll ups iwth B feet on therapeutic ball, holding bridge while  rolling knees to chest and out x 10 reps.      Knee/Hip Exercises: Sidelying   Other Sidelying Knee/Hip Exercises siedlying planks, 3 x 10 sec each side.      Knee/Hip Exercises: Prone   Other Prone Exercises planks, 3 x 10 seconds.                     PT Education - 01/24/21 1637     Education Details Educated to Updated HEP, POC for 4 more visits to establish a complete HEP to follow and continue progression after PT.    Person(s) Educated Patient    Methods Explanation;Demonstration;Handout    Comprehension Verbalized understanding;Returned demonstration              PT Short Term Goals - 01/24/21 1638       PT SHORT TERM GOAL #1   Title independent with initial HEP    Status Achieved               PT Long Term Goals - 01/24/21 1639       PT LONG TERM GOAL #1   Title FOTO improved to at least 76% for left knee function    Time 2  Period Weeks    Status On-going    Target Date 02/07/21      PT LONG TERM GOAL #2   Title Independent with advanced HEP and independent gym, running program    Time 2    Period Weeks    Status On-going    Target Date 02/07/21      PT LONG TERM GOAL #3   Title LE strength increased to at least 4+/5 with </= 2/10 pain or discomfort    Status Achieved      PT LONG TERM GOAL #4   Title Pt will report at least 50% improvement in pain with ADLs, walking, drving.    Baseline able to jog now intermittently    Time 2    Period Weeks    Status On-going    Target Date 02/07/21                    Patient will benefit from skilled therapeutic intervention in order to improve the following deficits and impairments:     Visit Diagnosis: Left knee pain, unspecified chronicity  Muscle weakness (generalized)  Stiffness of left knee, not elsewhere classified  Pain in left hip  Stiffness of left hip, not elsewhere classified  Other abnormalities of gait and mobility     Problem List Patient Active  Problem List   Diagnosis Date Noted   History of uterine fibroid 04/28/2018   Genital herpes 04/28/2018   Pre-diabetes 03/03/2017    Marcelina Morel, DPT 01/24/2021, 4:42 PM  [place]. [place], [name], [address] Phone: [phone]   Fax:  [phone]  Name: Alexis Golden MRN: [id number] Date of Birth: 29-Mar-1970

## 2021-01-25 ENCOUNTER — Ambulatory Visit: Payer: BC Managed Care – PPO | Admitting: Physical Therapy

## 2021-01-29 ENCOUNTER — Ambulatory Visit: Payer: BC Managed Care – PPO | Admitting: Physical Therapy

## 2021-01-29 ENCOUNTER — Encounter: Payer: Self-pay | Admitting: Physical Therapy

## 2021-01-29 ENCOUNTER — Other Ambulatory Visit: Payer: Self-pay

## 2021-01-29 DIAGNOSIS — M25662 Stiffness of left knee, not elsewhere classified: Secondary | ICD-10-CM | POA: Diagnosis not present

## 2021-01-29 DIAGNOSIS — R2689 Other abnormalities of gait and mobility: Secondary | ICD-10-CM

## 2021-01-29 DIAGNOSIS — M25552 Pain in left hip: Secondary | ICD-10-CM | POA: Diagnosis not present

## 2021-01-29 DIAGNOSIS — M6281 Muscle weakness (generalized): Secondary | ICD-10-CM

## 2021-01-29 DIAGNOSIS — L918 Other hypertrophic disorders of the skin: Secondary | ICD-10-CM | POA: Diagnosis not present

## 2021-01-29 DIAGNOSIS — L448 Other specified papulosquamous disorders: Secondary | ICD-10-CM | POA: Diagnosis not present

## 2021-01-29 DIAGNOSIS — M25562 Pain in left knee: Secondary | ICD-10-CM

## 2021-01-29 DIAGNOSIS — M25652 Stiffness of left hip, not elsewhere classified: Secondary | ICD-10-CM | POA: Diagnosis not present

## 2021-01-29 NOTE — Therapy (Signed)
[place]. [place], [name], [address] Phone: [phone]   Fax:  [phone]  Physical Therapy Treatment  Patient Details  Name: Alexis Golden MRN: [id number] Date of Birth: December 21, 1970 Referring Provider (PT): Steele Sizer, MD   Encounter Date: 01/29/2021   PT End of Session - 01/29/21 0849     Visit Number 11    Date for PT Re-Evaluation 02/28/21    PT Start Time 0757    PT Stop Time 0847    PT Time Calculation (min) 50 min    Activity Tolerance Patient tolerated treatment well    Behavior During Therapy [facility] for tasks assessed/performed             Past Medical History:  Diagnosis Date   Fibroids    Genital herpes     Past Surgical History:  Procedure Laterality Date   COLONOSCOPY WITH PROPOFOL N/A 12/18/2020   Procedure: COLONOSCOPY WITH PROPOFOL;  Surgeon: Jonathon Bellows, MD;  Location: [facility] ENDOSCOPY;  Service: Gastroenterology;  Laterality: N/A;   MYOMECTOMY  04/2003:06/2012   WISDOM TOOTH EXTRACTION  1990's    There were no vitals filed for this visit.   Subjective Assessment - 01/29/21 0757     Subjective Feeling better, only pain in the early mornings when cooler    Currently in Pain? No/denies                [facility] PT Assessment - 01/29/21 0001       Assessment   Medical Diagnosis M25.561 (ICD-10-CM) - Right knee pain, unspecified chronicity    Referring Provider (PT) Steele Sizer, MD                           [facility] Adult PT Treatment/Exercise - 01/29/21 0001       Knee/Hip Exercises: Stretches   Passive Hamstring Stretch Both;4 reps;20 seconds    Quad Stretch Left;3 reps;20 seconds    ITB Stretch Both;3 reps;20 seconds    Piriformis Stretch Both;3 reps;20 seconds      Knee/Hip Exercises: Aerobic   Elliptical Level 3 x 6 minutes total    Other Aerobic jog up hill outside x 5 walk down the hill      Knee/Hip Exercises: Machines for Strengthening    Cybex Leg Press SL 30# 2x10 each    Other Machine 10# straight arm pulls 2x10      Knee/Hip Exercises: Standing   Walking with Sports Cord outside all directions      Knee/Hip Exercises: Supine   Other Supine Knee/Hip Exercises feet on ball bridge and bridge with knees bent for HS, isometric abs      Knee/Hip Exercises: Sidelying   Other Sidelying Knee/Hip Exercises siedlying planks, 3 x 10 sec each side.      Knee/Hip Exercises: Prone   Other Prone Exercises 3 x 30 seconds                       PT Short Term Goals - 01/24/21 1638       PT SHORT TERM GOAL #1   Title independent with initial HEP    Status Achieved               PT Long Term Goals - 01/29/21 0854       PT LONG TERM GOAL #1   Title FOTO improved to at least 76% for left knee function  Status Partially Met      PT LONG TERM GOAL #2   Title Independent with advanced HEP and independent gym, running program    Status Partially Met      PT LONG TERM GOAL #3   Title LE strength increased to at least 4+/5 with </= 2/10 pain or discomfort    Status Achieved      PT LONG TERM GOAL #4   Title Pt will report at least 50% improvement in pain with ADLs, walking, drving.    Status On-going                   Plan - 01/29/21 0849     Clinical Impression Statement I intiated some light jogging up the hill, also added more core stability, and stretching, she is tight and the core is weak.  Overall doing better but needs to try to get back to PLOF with her jogging 40 minutes    PT Next Visit Plan Continue hip and eccentric knee strengthening. Continue to work on plyometrics for improved return to run; add pivot activities    Consulted and Agree with Plan of Care Patient             Patient will benefit from skilled therapeutic intervention in order to improve the following deficits and impairments:  Abnormal gait, Decreased range of motion, Decreased balance, Pain, Impaired  flexibility, Decreased strength  Visit Diagnosis: Left knee pain, unspecified chronicity - Plan: PT plan of care cert/re-cert  Muscle weakness (generalized) - Plan: PT plan of care cert/re-cert  Stiffness of left knee, not elsewhere classified - Plan: PT plan of care cert/re-cert  Pain in left hip - Plan: PT plan of care cert/re-cert  Stiffness of left hip, not elsewhere classified - Plan: PT plan of care cert/re-cert  Other abnormalities of gait and mobility - Plan: PT plan of care cert/re-cert     Problem List Patient Active Problem List   Diagnosis Date Noted   History of uterine fibroid 04/28/2018   Genital herpes 04/28/2018   Pre-diabetes 03/03/2017    Sumner Boast, PT 01/29/2021, 9:43 AM  [place]. [place], [name], [address] Phone: [phone]   Fax:  [phone]  Name: Alexis Golden MRN: [id number] Date of Birth: May 11, 1970

## 2021-02-01 ENCOUNTER — Other Ambulatory Visit: Payer: Self-pay

## 2021-02-01 ENCOUNTER — Encounter: Payer: Self-pay | Admitting: Physical Therapy

## 2021-02-01 ENCOUNTER — Ambulatory Visit: Payer: BC Managed Care – PPO | Admitting: Physical Therapy

## 2021-02-01 DIAGNOSIS — M6281 Muscle weakness (generalized): Secondary | ICD-10-CM

## 2021-02-01 DIAGNOSIS — M25562 Pain in left knee: Secondary | ICD-10-CM | POA: Diagnosis not present

## 2021-02-01 DIAGNOSIS — M25662 Stiffness of left knee, not elsewhere classified: Secondary | ICD-10-CM

## 2021-02-01 DIAGNOSIS — R2689 Other abnormalities of gait and mobility: Secondary | ICD-10-CM | POA: Diagnosis not present

## 2021-02-01 DIAGNOSIS — M25652 Stiffness of left hip, not elsewhere classified: Secondary | ICD-10-CM | POA: Diagnosis not present

## 2021-02-01 DIAGNOSIS — M25552 Pain in left hip: Secondary | ICD-10-CM | POA: Diagnosis not present

## 2021-02-01 NOTE — Therapy (Signed)
[place]. [place], [name], [address] Phone: [phone]   Fax:  [phone]  Physical Therapy Treatment  Patient Details  Name: Alexis Golden MRN: [id number] Date of Birth: 1970-05-15 Referring Provider (PT): Steele Sizer, MD   Encounter Date: 02/01/2021    Past Medical History:  Diagnosis Date   Fibroids    Genital herpes     Past Surgical History:  Procedure Laterality Date   COLONOSCOPY WITH PROPOFOL N/A 12/18/2020   Procedure: COLONOSCOPY WITH PROPOFOL;  Surgeon: Jonathon Bellows, MD;  Location: [facility] ENDOSCOPY;  Service: Gastroenterology;  Laterality: N/A;   MYOMECTOMY  04/2003:06/2012   WISDOM TOOTH EXTRACTION  1990's    There were no vitals filed for this visit.   Subjective Assessment - 02/01/21 0758     Subjective "Im good"    Currently in Pain? No/denies                               [facility] Adult PT Treatment/Exercise - 02/01/21 0001       Knee/Hip Exercises: Aerobic   Elliptical Level 3 x 6 minutes total      Knee/Hip Exercises: Machines for Strengthening   Cybex Knee Extension 10# 2x10    Cybex Knee Flexion 35# 2x12    Cybex Leg Press SL 30# 2x10 each    Other Machine 10# straight arm pulls 2x10      Knee/Hip Exercises: Standing   Step Down Left;2 sets;10 reps;Hand Hold: 0;Step Height: 4"   eccentrics   Other Standing Knee Exercises SL DL 5lb 2x10 LLE    Other Standing Knee Exercises Split squat & row 20lb 2x10      Knee/Hip Exercises: Seated   Sit to Sand 2 sets;without UE support;10 reps   LLE                      PT Short Term Goals - 01/24/21 1638       PT SHORT TERM GOAL #1   Title independent with initial HEP    Status Achieved               PT Long Term Goals - 01/29/21 0854       PT LONG TERM GOAL #1   Title FOTO improved to at least 76% for left knee function    Status Partially Met      PT LONG TERM GOAL #2   Title  Independent with advanced HEP and independent gym, running program    Status Partially Met      PT LONG TERM GOAL #3   Title LE strength increased to at least 4+/5 with </= 2/10 pain or discomfort    Status Achieved      PT LONG TERM GOAL #4   Title Pt will report at least 50% improvement in pain with ADLs, walking, drving.    Status On-going                   Plan - 02/01/21 0843     Clinical Impression Statement Unable to go outside due to weather. Some visual LLE shaking with eccentric step downs. Some instability with SL dead lifts knee Varus/valgus noted to maintain stability. Increase fatigue present with split squat and row.    Stability/Clinical Decision Making Stable/Uncomplicated    Rehab Potential Good    PT Frequency 2x / week    PT Duration  8 weeks    PT Treatment/Interventions ADLs/Self Care Home Management;Electrical Stimulation;Cryotherapy;Iontophoresis 44m/ml Dexamethasone;Moist Heat;Functional mobility training;Therapeutic activities;Therapeutic exercise;Balance training;Neuromuscular re-education;Patient/family education;Manual techniques;Dry needling;Taping    PT Next Visit Plan Continue hip and eccentric knee strengthening. Continue to work on plyometrics for improved return to run; add pivot activities             Patient will benefit from skilled therapeutic intervention in order to improve the following deficits and impairments:  Abnormal gait, Decreased range of motion, Decreased balance, Pain, Impaired flexibility, Decreased strength  Visit Diagnosis: Muscle weakness (generalized)  Stiffness of left knee, not elsewhere classified  Pain in left hip  Stiffness of left hip, not elsewhere classified     Problem List Patient Active Problem List   Diagnosis Date Noted   History of uterine fibroid 04/28/2018   Genital herpes 04/28/2018   Pre-diabetes 03/03/2017    RScot Jun PTA 02/01/2021, 8:45 AM  [place] [address]Phone: [phone]  Fax:  [phone] Name: Alexis MenzelMRN: [id number] of Birth: 203/30/1972

## 2021-02-08 ENCOUNTER — Ambulatory Visit: Payer: BC Managed Care – PPO | Admitting: Physical Therapy

## 2021-02-08 ENCOUNTER — Other Ambulatory Visit: Payer: Self-pay

## 2021-02-08 DIAGNOSIS — M25552 Pain in left hip: Secondary | ICD-10-CM | POA: Diagnosis not present

## 2021-02-08 DIAGNOSIS — M25562 Pain in left knee: Secondary | ICD-10-CM | POA: Diagnosis not present

## 2021-02-08 DIAGNOSIS — M25662 Stiffness of left knee, not elsewhere classified: Secondary | ICD-10-CM | POA: Diagnosis not present

## 2021-02-08 DIAGNOSIS — M6281 Muscle weakness (generalized): Secondary | ICD-10-CM

## 2021-02-08 DIAGNOSIS — M25652 Stiffness of left hip, not elsewhere classified: Secondary | ICD-10-CM | POA: Diagnosis not present

## 2021-02-08 DIAGNOSIS — R2689 Other abnormalities of gait and mobility: Secondary | ICD-10-CM | POA: Diagnosis not present

## 2021-02-08 NOTE — Therapy (Signed)
[place]. [place], [name], [address] Phone: [phone]   Fax:  [phone]  Physical Therapy Treatment  Patient Details  Name: Alexis Golden MRN: [id number] Date of Birth: [birth year] Referring Provider (PT): Steele Sizer, MD   Encounter Date: 02/08/2021   PT End of Session - 02/08/21 0839     Visit Number 12    Date for PT Re-Evaluation 02/28/21    PT Start Time 0800    PT Stop Time 0850    PT Time Calculation (min) 50 min             Past Medical History:  Diagnosis Date   Fibroids    Genital herpes     Past Surgical History:  Procedure Laterality Date   COLONOSCOPY WITH PROPOFOL N/A 12/18/2020   Procedure: COLONOSCOPY WITH PROPOFOL;  Surgeon: Jonathon Bellows, MD;  Location: [facility] ENDOSCOPY;  Service: Gastroenterology;  Laterality: N/A;   MYOMECTOMY  04/2003:06/2012   WISDOM TOOTH EXTRACTION  1990's    There were no vitals filed for this visit.   Subjective Assessment - 02/08/21 0758     Subjective I was great until I wore cute shoes/ small wedge and not alittle pain    Currently in Pain? Yes    Pain Score 3     Pain Location Knee    Pain Orientation Left;Mid                               [facility] Adult PT Treatment/Exercise - 02/08/21 0001       Knee/Hip Exercises: Aerobic   Elliptical L 4 3 fwd/3 back      Knee/Hip Exercises: Machines for Strengthening   Cybex Leg Press 50# toes 3 way 15 x each   30# LLE only 10 x 2 sets     Knee/Hip Exercises: Plyometrics   Other Plyometric Exercises box jump DL and SL, cursty lunge      Knee/Hip Exercises: Standing   Functional Squat 2 sets;10 reps   on BOSU working to keep knees from falling in   Other Standing Knee Exercises SL DL 5lb 2x10 LLE   decreased stability   Other Standing Knee Exercises 6 inch step up 15 x fwd,laterally for ADB and laterally for ADD   6 inch step down. cable pulleys hip  4 way BIL 10 #      Iontophoresis   Type of Iontophoresis Dexamethasone    Location left lateral knee    Dose 1.2 cc    Time 80 ma 4 hour patch                       PT Short Term Goals - 01/24/21 1638       PT SHORT TERM GOAL #1   Title independent with initial HEP    Status Achieved               PT Long Term Goals - 02/08/21 7782       PT LONG TERM GOAL #2   Title Independent with advanced HEP and independent gym, running program    Status Partially Met      PT LONG TERM GOAL #3   Title LE strength increased to at least 4+/5 with </= 2/10 pain or discomfort    Status Achieved      PT LONG TERM GOAL #4   Title Pt will report at  least 50% improvement in pain with ADLs, walking, drving.    Status Achieved                   Plan - 02/08/21 0839     Clinical Impression Statement pt was doing very well and working towards advanced HEP And return to running but had slight set ba+ck this week after wearing shoes with small wedge heel. pt had some decreased stability with dynamcic SL stance ex. added ionto patch today d/t pain    PT Next Visit Plan assess and progress             Patient will benefit from skilled therapeutic intervention in order to improve the following deficits and impairments:  Abnormal gait, Decreased range of motion, Decreased balance, Pain, Impaired flexibility, Decreased strength  Visit Diagnosis: Muscle weakness (generalized)  Left knee pain, unspecified chronicity     Problem List Patient Active Problem List   Diagnosis Date Noted   History of uterine fibroid 04/28/2018   Genital herpes 04/28/2018   Pre-diabetes 03/03/2017    Alexis Golden,Alexis Golden, PTA 02/08/2021, 8:41 AM  [place]. [place], [name], [address] Phone: [phone]   Fax:  [phone]  Name: Alexis Golden MRN: [id number] Date of Birth: 11/04/1970

## 2021-02-12 ENCOUNTER — Encounter: Payer: Self-pay | Admitting: Physical Therapy

## 2021-02-12 ENCOUNTER — Other Ambulatory Visit: Payer: Self-pay

## 2021-02-12 ENCOUNTER — Ambulatory Visit: Payer: BC Managed Care – PPO | Admitting: Physical Therapy

## 2021-02-12 DIAGNOSIS — M6281 Muscle weakness (generalized): Secondary | ICD-10-CM | POA: Diagnosis not present

## 2021-02-12 DIAGNOSIS — M25562 Pain in left knee: Secondary | ICD-10-CM | POA: Diagnosis not present

## 2021-02-12 DIAGNOSIS — M25652 Stiffness of left hip, not elsewhere classified: Secondary | ICD-10-CM

## 2021-02-12 DIAGNOSIS — M25552 Pain in left hip: Secondary | ICD-10-CM | POA: Diagnosis not present

## 2021-02-12 DIAGNOSIS — M25662 Stiffness of left knee, not elsewhere classified: Secondary | ICD-10-CM | POA: Diagnosis not present

## 2021-02-12 DIAGNOSIS — R2689 Other abnormalities of gait and mobility: Secondary | ICD-10-CM | POA: Diagnosis not present

## 2021-02-12 NOTE — Therapy (Signed)
[address]. [place], [name], [address] Phone: [phone]   Fax:  [phone]  Physical Therapy Treatment  Patient Details  Name: Alexis [place] MRN: [id number] Date of Birth: [birth year] Referring Provider (PT): Steele Sizer, MD   Encounter Date: 02/12/2021   PT End of Session - 02/12/21 0838     Visit Number 13    Date for PT Re-Evaluation 02/28/21    PT Start Time 0758    PT Stop Time 0840    PT Time Calculation (min) 42 min    Activity Tolerance Patient tolerated treatment well    Behavior During Therapy [facility] for tasks assessed/performed             Past Medical History:  Diagnosis Date   Fibroids    Genital herpes     Past Surgical History:  Procedure Laterality Date   COLONOSCOPY WITH PROPOFOL N/A 12/18/2020   Procedure: COLONOSCOPY WITH PROPOFOL;  Surgeon: Jonathon Bellows, MD;  Location: [facility] ENDOSCOPY;  Service: Gastroenterology;  Laterality: N/A;   MYOMECTOMY  04/2003:06/2012   WISDOM TOOTH EXTRACTION  1990's    There were no vitals filed for this visit.   Subjective Assessment - 02/12/21 0758     Subjective "Im good today"    Currently in Pain? Yes    Pain Score 1     Pain Orientation Left                               [facility] Adult PT Treatment/Exercise - 02/12/21 0001       Knee/Hip Exercises: Aerobic   Elliptical L 4 3 fwd/3 back      Knee/Hip Exercises: Machines for Strengthening   Cybex Knee Extension LLE 10lb 2x10    Cybex Knee Flexion LLE 20lb 2x10    Cybex Leg Press 50# 2x15      Knee/Hip Exercises: Standing   Forward Step Up Left;2 sets;10 reps;Hand Hold: 0   12''   Step Down Left;2 sets;10 reps;Hand Hold: 0;Step Height: 4"   exxentrics   Functional Squat 10 reps;3 seconds;1 set   20lb bottom loaded   Other Standing Knee Exercises LLE SL DL yellow and pull 4lb 2x10      Knee/Hip Exercises: Seated   Sit to Sand 2 sets;10 reps;without UE support   LLE  only on airex                      PT Short Term Goals - 01/24/21 1638       PT SHORT TERM GOAL #1   Title independent with initial HEP    Status Achieved               PT Long Term Goals - 02/08/21 6962       PT LONG TERM GOAL #2   Title Independent with advanced HEP and independent gym, running program    Status Partially Met      PT LONG TERM GOAL #3   Title LE strength increased to at least 4+/5 with </= 2/10 pain or discomfort    Status Achieved      PT LONG TERM GOAL #4   Title Pt will report at least 50% improvement in pain with ADLs, walking, drving.    Status Achieved                   Plan - 02/12/21 9528  Clinical Impression Statement Pt continues to do well overall. Some LLE shaking with single leg dead lifts and single leg sit to stand on airex. Cues not to compensate with 12'' step ups. Pt sis report some L anterior knee pain with the last two reps of sit to stands, during the last two reps heels did lift and knees went past toes.    Stability/Clinical Decision Making Stable/Uncomplicated    Rehab Potential Good    PT Frequency 2x / week    PT Treatment/Interventions ADLs/Self Care Home Management;Electrical Stimulation;Cryotherapy;Iontophoresis 77m/ml Dexamethasone;Moist Heat;Functional mobility training;Therapeutic activities;Therapeutic exercise;Balance training;Neuromuscular re-education;Patient/family education;Manual techniques;Dry needling;Taping    PT Next Visit Plan assess and progress             Patient will benefit from skilled therapeutic intervention in order to improve the following deficits and impairments:  Abnormal gait, Decreased range of motion, Decreased balance, Pain, Impaired flexibility, Decreased strength  Visit Diagnosis: Muscle weakness (generalized)  Stiffness of left knee, not elsewhere classified  Pain in left hip  Left knee pain, unspecified chronicity  Stiffness of left hip, not  elsewhere classified     Problem List Patient Active Problem List   Diagnosis Date Noted   History of uterine fibroid 04/28/2018   Genital herpes 04/28/2018   Pre-diabetes 03/03/2017    RScot Jun PTA 02/12/2021, 8:42 AM  [place] [address]Phone: [phone]  Fax:  [phone] Name: Alexis RedcayMRM: [id number] of Birth: [birth year]

## 2021-02-18 ENCOUNTER — Encounter: Payer: Self-pay | Admitting: Physical Therapy

## 2021-02-18 ENCOUNTER — Other Ambulatory Visit: Payer: Self-pay

## 2021-02-18 ENCOUNTER — Ambulatory Visit: Payer: BC Managed Care – PPO | Admitting: Physical Therapy

## 2021-02-18 DIAGNOSIS — M25562 Pain in left knee: Secondary | ICD-10-CM | POA: Diagnosis not present

## 2021-02-18 DIAGNOSIS — M25662 Stiffness of left knee, not elsewhere classified: Secondary | ICD-10-CM

## 2021-02-18 DIAGNOSIS — M6281 Muscle weakness (generalized): Secondary | ICD-10-CM | POA: Diagnosis not present

## 2021-02-18 DIAGNOSIS — M25552 Pain in left hip: Secondary | ICD-10-CM | POA: Diagnosis not present

## 2021-02-18 DIAGNOSIS — M25652 Stiffness of left hip, not elsewhere classified: Secondary | ICD-10-CM | POA: Diagnosis not present

## 2021-02-18 DIAGNOSIS — R2689 Other abnormalities of gait and mobility: Secondary | ICD-10-CM | POA: Diagnosis not present

## 2021-02-18 NOTE — Therapy (Signed)
[name], [name], [address] Phone: [phone]   Fax:  [phone]  Physical Therapy Treatment  Patient Details  Name: Alexis Golden MRN: [id number] Date of Birth: 12-Jul-1970 Referring Provider (PT): Steele Sizer, MD   Encounter Date: 02/18/2021   PT End of Session - 02/18/21 0839     Visit Number 14    Date for PT Re-Evaluation 02/28/21    PT Start Time 0758    PT Stop Time 0843    PT Time Calculation (min) 45 min    Activity Tolerance Patient tolerated treatment well    Behavior During Therapy [facility] for tasks assessed/performed             Past Medical History:  Diagnosis Date   Fibroids    Genital herpes     Past Surgical History:  Procedure Laterality Date   COLONOSCOPY WITH PROPOFOL N/A 12/18/2020   Procedure: COLONOSCOPY WITH PROPOFOL;  Surgeon: Jonathon Bellows, MD;  Location: [facility] ENDOSCOPY;  Service: Gastroenterology;  Laterality: N/A;   MYOMECTOMY  04/2003:06/2012   WISDOM TOOTH EXTRACTION  1990's    There were no vitals filed for this visit.   Subjective Assessment - 02/18/21 0759     Subjective "Im good" Did a walk/jog Saturday without pain    Currently in Pain? No/denies                               [facility] Adult PT Treatment/Exercise - 02/18/21 0001       Knee/Hip Exercises: Aerobic   Elliptical L 4 3 fwd/3 back      Knee/Hip Exercises: Machines for Strengthening   Cybex Knee Extension 15# 2x12    Cybex Knee Flexion 35# 2x12    Other Machine shoulder Ext 10lb 2x10      Knee/Hip Exercises: Standing   Heel Raises Both;2 sets;15 reps;2 seconds    Step Down Left;2 sets;10 reps;Hand Hold: 0;Step Height: 6"   eccentrics   Other Standing Knee Exercises LLE SL DL on airex 6lb 2x10, 20lb bottom loaded queats 2x10    Other Standing Knee Exercises Alt lunges 3x10 total                       PT Short Term Goals - 01/24/21 1638       PT SHORT  TERM GOAL #1   Title independent with initial HEP    Status Achieved               PT Long Term Goals - 02/18/21 1017       PT LONG TERM GOAL #2   Title Independent with advanced HEP and independent gym, running program    Status Partially Met      PT LONG TERM GOAL #3   Title LE strength increased to at least 4+/5 with </= 2/10 pain or discomfort    Status Achieved      PT LONG TERM GOAL #4   Title Pt will report at least 50% improvement in pain with ADLs, walking, drving.    Status Achieved                   Plan - 02/18/21 0842     Clinical Impression Statement Pt reports walking/jogging over the weekend without issues. Cue needed for full TKE with eccentrics on 6 inch box. Instability with SL DL on airex. Some weakness  noted with alternating lungs. No pain with bottom loaded squats compared to last time, focus on keeping hips back.    Stability/Clinical Decision Making Stable/Uncomplicated    Rehab Potential Good    PT Frequency 2x / week    PT Duration 8 weeks    PT Treatment/Interventions ADLs/Self Care Home Management;Electrical Stimulation;Cryotherapy;Iontophoresis 36m/ml Dexamethasone;Moist Heat;Functional mobility training;Therapeutic activities;Therapeutic exercise;Balance training;Neuromuscular re-education;Patient/family education;Manual techniques;Dry needling;Taping    PT Next Visit Plan assess and progress             Patient will benefit from skilled therapeutic intervention in order to improve the following deficits and impairments:  Abnormal gait, Decreased range of motion, Decreased balance, Pain, Impaired flexibility, Decreased strength  Visit Diagnosis: Muscle weakness (generalized)  Pain in left hip  Stiffness of left knee, not elsewhere classified  Left knee pain, unspecified chronicity     Problem List Patient Active Problem List   Diagnosis Date Noted   History of uterine fibroid 04/28/2018   Genital herpes 04/28/2018    Pre-diabetes 03/03/2017    RScot Jun PTA,  02/18/2021, 8:45 AM  [place] [address]Phone: [phone]  Fax:  [phone] Name: Alexis SanterreMRN: [id number] of Birth: [birth year]

## 2021-02-20 ENCOUNTER — Other Ambulatory Visit: Payer: Self-pay

## 2021-02-20 ENCOUNTER — Encounter: Payer: Self-pay | Admitting: Physical Therapy

## 2021-02-20 ENCOUNTER — Ambulatory Visit: Payer: BC Managed Care – PPO | Admitting: Physical Therapy

## 2021-02-20 DIAGNOSIS — M25652 Stiffness of left hip, not elsewhere classified: Secondary | ICD-10-CM | POA: Diagnosis not present

## 2021-02-20 DIAGNOSIS — M25662 Stiffness of left knee, not elsewhere classified: Secondary | ICD-10-CM

## 2021-02-20 DIAGNOSIS — R2689 Other abnormalities of gait and mobility: Secondary | ICD-10-CM | POA: Diagnosis not present

## 2021-02-20 DIAGNOSIS — M6281 Muscle weakness (generalized): Secondary | ICD-10-CM

## 2021-02-20 DIAGNOSIS — M25552 Pain in left hip: Secondary | ICD-10-CM

## 2021-02-20 DIAGNOSIS — M25562 Pain in left knee: Secondary | ICD-10-CM | POA: Diagnosis not present

## 2021-02-20 NOTE — Therapy (Signed)
[place]. [place], [name], [address] Phone: [phone]   Fax:  [phone]  Physical Therapy Treatment  Patient Details  Name: Alexis Golden MRN: [id number] Date of Birth: April 15, 1970 Referring Provider (PT): Steele Sizer, MD   Encounter Date: 02/20/2021    Past Medical History:  Diagnosis Date   Fibroids    Genital herpes     Past Surgical History:  Procedure Laterality Date   COLONOSCOPY WITH PROPOFOL N/A 12/18/2020   Procedure: COLONOSCOPY WITH PROPOFOL;  Surgeon: Jonathon Bellows, MD;  Location: [facility] ENDOSCOPY;  Service: Gastroenterology;  Laterality: N/A;   MYOMECTOMY  04/2003:06/2012   WISDOM TOOTH EXTRACTION  1990's    There were no vitals filed for this visit.   Subjective Assessment - 02/20/21 0804     Subjective "Good"    Currently in Pain? No/denies                               [facility] Adult PT Treatment/Exercise - 02/20/21 0001       Knee/Hip Exercises: Aerobic   Elliptical L 4 3 fwd/3 back      Knee/Hip Exercises: Machines for Strengthening   Cybex Knee Extension 15# 2x15    Cybex Knee Flexion 35# 2x15      Knee/Hip Exercises: Plyometrics   Other Plyometric Exercises lateral jumps      Knee/Hip Exercises: Standing   Heel Raises Both;2 sets;15 reps;2 seconds    Forward Step Up Both;1 set;10 reps;Hand Hold: 0   12''   Walking with Sports Cord 30lb side step over 2 half foam rolls x5 each    Other Standing Knee Exercises Alt lunges 3x10 total   10lb     Knee/Hip Exercises: Seated   Sit to Sand 2 sets;10 reps;without UE support   LLE on airex                      PT Short Term Goals - 01/24/21 1638       PT SHORT TERM GOAL #1   Title independent with initial HEP    Status Achieved               PT Long Term Goals - 02/20/21 0841       PT LONG TERM GOAL #1   Title FOTO improved to at least 76% for left knee function    Status Partially  Met      PT LONG TERM GOAL #2   Title Independent with advanced HEP and independent gym, running program    Status Partially Met      PT LONG TERM GOAL #3   Title LE strength increased to at least 4+/5 with </= 2/10 pain or discomfort    Period Weeks    Status Achieved      PT LONG TERM GOAL #4   Title Pt will report at least 50% improvement in pain with ADLs, walking, drving.    Status Achieved                   Plan - 02/20/21 0841     Clinical Impression Statement No issues completing today's interventions. Some weakness and instability with SL sit to stands. Cues to prevent compensation with step ups. Some core weakness with alternating lunges at the bottom of the rep. Has not gotten a gym membership yet.    Stability/Clinical Decision Making Stable/Uncomplicated  Rehab Potential Good    PT Frequency 2x / week    PT Treatment/Interventions ADLs/Self Care Home Management;Electrical Stimulation;Cryotherapy;Iontophoresis 2m/ml Dexamethasone;Moist Heat;Functional mobility training;Therapeutic activities;Therapeutic exercise;Balance training;Neuromuscular re-education;Patient/family education;Manual techniques;Dry needling;Taping    PT Next Visit Plan assess and progress             Patient will benefit from skilled therapeutic intervention in order to improve the following deficits and impairments:  Abnormal gait, Decreased range of motion, Decreased balance, Pain, Impaired flexibility, Decreased strength  Visit Diagnosis: Muscle weakness (generalized)  Stiffness of left knee, not elsewhere classified  Pain in left hip  Left knee pain, unspecified chronicity     Problem List Patient Active Problem List   Diagnosis Date Noted   History of uterine fibroid 04/28/2018   Genital herpes 04/28/2018   Pre-diabetes 03/03/2017    RScot Jun PTA 02/20/2021, 8:45 AM  [place] [address]Phone: [phone]  Fax:  [phone] Name: Alexis StamantMRN: [id number] of Birth: [birth year]

## 2021-02-25 ENCOUNTER — Encounter: Payer: Self-pay | Admitting: Physical Therapy

## 2021-02-25 ENCOUNTER — Ambulatory Visit: Payer: BC Managed Care – PPO | Attending: Family Medicine | Admitting: Physical Therapy

## 2021-02-25 ENCOUNTER — Other Ambulatory Visit: Payer: Self-pay

## 2021-02-25 DIAGNOSIS — M6281 Muscle weakness (generalized): Secondary | ICD-10-CM | POA: Diagnosis not present

## 2021-02-25 DIAGNOSIS — M25562 Pain in left knee: Secondary | ICD-10-CM | POA: Diagnosis not present

## 2021-02-25 DIAGNOSIS — M25662 Stiffness of left knee, not elsewhere classified: Secondary | ICD-10-CM | POA: Insufficient documentation

## 2021-02-25 DIAGNOSIS — M25552 Pain in left hip: Secondary | ICD-10-CM | POA: Diagnosis not present

## 2021-02-25 NOTE — Therapy (Signed)
[place] [place], [name], [address] Phone: [phone]   Fax:  [phone]  Physical Therapy Treatment  Patient Details  Name: Alexis Golden MRN: [id number] Date of Birth: 10/10/1970 Referring Provider (PT): Steele Sizer, MD   Encounter Date: 02/25/2021   PT End of Session - 02/25/21 0840     Visit Number 15    PT Start Time 0800    PT Stop Time 0845    PT Time Calculation (min) 45 min    Activity Tolerance Patient tolerated treatment well    Behavior During Therapy [facility] for tasks assessed/performed             Past Medical History:  Diagnosis Date   Fibroids    Genital herpes     Past Surgical History:  Procedure Laterality Date   COLONOSCOPY WITH PROPOFOL N/A 12/18/2020   Procedure: COLONOSCOPY WITH PROPOFOL;  Surgeon: Jonathon Bellows, MD;  Location: [facility] ENDOSCOPY;  Service: Gastroenterology;  Laterality: N/A;   MYOMECTOMY  04/2003:06/2012   WISDOM TOOTH EXTRACTION  1990's    There were no vitals filed for this visit.   Subjective Assessment - 02/25/21 0801     Subjective "Im good"    Currently in Pain? No/denies                               [facility] Adult PT Treatment/Exercise - 02/25/21 0001       Knee/Hip Exercises: Aerobic   Elliptical L 4 3 fwd/3 back      Knee/Hip Exercises: Machines for Strengthening   Cybex Knee Extension 20# 2x12    Cybex Knee Flexion 45lb 2x12    Cybex Leg Press 60# 2x12      Knee/Hip Exercises: Plyometrics   Other Plyometric Exercises lateral jumps      Knee/Hip Exercises: Standing   Heel Raises Both;2 sets;15 reps;2 seconds    Forward Step Up Both;1 set;10 reps;Hand Hold: 0;Step Height: 6"   airex on 6in box   Step Down Both;1 set;10 reps;Hand Hold: 0;Step Height: 6"   airex on box   Other Standing Knee Exercises walking lunges    Other Standing Knee Exercises SL DL 5lb 2x10      Knee/Hip Exercises: Seated   Sit to Sand 2 sets;10  reps;without UE support   LE on airex, 10lb                      PT Short Term Goals - 01/24/21 1638       PT SHORT TERM GOAL #1   Title independent with initial HEP    Status Achieved               PT Long Term Goals - 02/20/21 0841       PT LONG TERM GOAL #1   Title FOTO improved to at least 76% for left knee function    Status Partially Met      PT LONG TERM GOAL #2   Title Independent with advanced HEP and independent gym, running program    Status Partially Met      PT LONG TERM GOAL #3   Title LE strength increased to at least 4+/5 with </= 2/10 pain or discomfort    Period Weeks    Status Achieved      PT LONG TERM GOAL #4   Title Pt will report at least 50% improvement  in pain with ADLs, walking, drving.    Status Achieved                   Plan - 02/25/21 0840     Clinical Impression Statement Pt did well in today's session. Increase resistance tolerated an all machine level interventions. Some HS weakness present with curls evident by pt needing to rest during set. Some ankle instability on non compliant surfaces with step up but able to mat ain stability. SL dead lift did pose a stability challenge for patient.    Stability/Clinical Decision Making Stable/Uncomplicated    Rehab Potential Good    PT Frequency 2x / week    PT Duration 8 weeks    PT Treatment/Interventions ADLs/Self Care Home Management;Electrical Stimulation;Cryotherapy;Iontophoresis 56m/ml Dexamethasone;Moist Heat;Functional mobility training;Therapeutic activities;Therapeutic exercise;Balance training;Neuromuscular re-education;Patient/family education;Manual techniques;Dry needling;Taping    PT Next Visit Plan assess and progress, possible [place] next visit            Patient will benefit from skilled therapeutic intervention in order to improve the following deficits and impairments:  Abnormal gait, Decreased range of motion, Decreased balance, Pain, Impaired  flexibility, Decreased strength  Visit Diagnosis: Muscle weakness (generalized)  Stiffness of left knee, not elsewhere classified  Pain in left hip  Left knee pain, unspecified chronicity     Problem List Patient Active Problem List   Diagnosis Date Noted   History of uterine fibroid 04/28/2018   Genital herpes 04/28/2018   Pre-diabetes 03/03/2017    RScot Jun PTA 02/25/2021, 8:43 AM  [place] [address]Phone: [phone]  Fax:  [phone] Name: Alexis JamersonMRN: [id number] of Birth: 206-10-1970

## 2021-02-27 ENCOUNTER — Encounter: Payer: Self-pay | Admitting: Physical Therapy

## 2021-02-27 ENCOUNTER — Other Ambulatory Visit: Payer: Self-pay

## 2021-02-27 ENCOUNTER — Ambulatory Visit: Payer: BC Managed Care – PPO | Admitting: Physical Therapy

## 2021-02-27 DIAGNOSIS — M25662 Stiffness of left knee, not elsewhere classified: Secondary | ICD-10-CM | POA: Diagnosis not present

## 2021-02-27 DIAGNOSIS — M6281 Muscle weakness (generalized): Secondary | ICD-10-CM | POA: Diagnosis not present

## 2021-02-27 DIAGNOSIS — M25552 Pain in left hip: Secondary | ICD-10-CM | POA: Diagnosis not present

## 2021-02-27 DIAGNOSIS — M25562 Pain in left knee: Secondary | ICD-10-CM | POA: Diagnosis not present

## 2021-02-27 NOTE — Therapy (Signed)
[place]. [place], [name], [address] Phone: [phone]   Fax:  [phone]  Physical Therapy Treatment  Patient Details  Name: Daritza Brees MRN: [id number] Date of Birth: Apr 04, 1970 Referring Provider (PT): Steele Sizer, MD   Encounter Date: 02/27/2021   PT End of Session - 02/27/21 0844     Visit Number 16    Date for PT Re-Evaluation 02/28/21    PT Start Time 0800    PT Stop Time 0845    PT Time Calculation (min) 45 min    Activity Tolerance Patient tolerated treatment well    Behavior During Therapy [facility] for tasks assessed/performed             Past Medical History:  Diagnosis Date   Fibroids    Genital herpes     Past Surgical History:  Procedure Laterality Date   COLONOSCOPY WITH PROPOFOL N/A 12/18/2020   Procedure: COLONOSCOPY WITH PROPOFOL;  Surgeon: Jonathon Bellows, MD;  Location: [facility] ENDOSCOPY;  Service: Gastroenterology;  Laterality: N/A;   MYOMECTOMY  04/2003:06/2012   WISDOM TOOTH EXTRACTION  1990's    There were no vitals filed for this visit.   Subjective Assessment - 02/27/21 0806     Subjective "Good"    Currently in Pain? No/denies                               [facility] Adult PT Treatment/Exercise - 02/27/21 0001       Knee/Hip Exercises: Aerobic   Elliptical L 4 3 fwd/3 back      Knee/Hip Exercises: Machines for Strengthening   Cybex Knee Extension 10lb 2x10    Cybex Knee Flexion LLE 25lb 2x10    Cybex Leg Press 60# 2x12      Knee/Hip Exercises: Standing   Heel Raises Both;2 sets;15 reps;2 seconds    Forward Step Up Left;2 sets;10 reps;Hand Hold: 0   12''   Step Down Left;2 sets;10 reps;Hand Hold: 1;Step Height: 6"   eccentric   Functional Squat 2 sets;10 reps;3 seconds   on BOSU     Knee/Hip Exercises: Seated   Sit to Sand 2 sets;10 reps;without UE support   20lb                      PT Short Term Goals - 01/24/21 1638       PT  SHORT TERM GOAL #1   Title independent with initial HEP    Status Achieved               PT Long Term Goals - 02/27/21 0844       PT LONG TERM GOAL #1   Title FOTO improved to at least 76% for left knee function    Status Achieved                   Plan - 02/27/21 0844     Clinical Impression Statement Pt has progressed meeting all goals. no issues completing today's interventions and reports no functional limitations.    Stability/Clinical Decision Making Stable/Uncomplicated    Rehab Potential Good    PT Frequency 2x / week    PT Duration 8 weeks    PT Treatment/Interventions ADLs/Self Care Home Management;Electrical Stimulation;Cryotherapy;Iontophoresis 31m/ml Dexamethasone;Moist Heat;Functional mobility training;Therapeutic activities;Therapeutic exercise;Balance training;Neuromuscular re-education;Patient/family education;Manual techniques;Dry needling;Taping    PT Next Visit Plan D/C PT  Patient will benefit from skilled therapeutic intervention in order to improve the following deficits and impairments:  Abnormal gait, Decreased range of motion, Decreased balance, Pain, Impaired flexibility, Decreased strength  Visit Diagnosis: Muscle weakness (generalized)  Pain in left hip  Stiffness of left knee, not elsewhere classified  Left knee pain, unspecified chronicity     Problem List Patient Active Problem List   Diagnosis Date Noted   History of uterine fibroid 04/28/2018   Genital herpes 04/28/2018   Pre-diabetes 03/03/2017   PHYSICAL THERAPY DISCHARGE SUMMARY  Visits from Start of Care: 16    Patient agrees to discharge. Patient goals were met. Patient is being discharged due to meeting the stated rehab goals.   [name], PTA 02/27/2021, 8:45 AM  [place]. [place], [name], [address] Phone: [phone]   Fax:  [phone]  Name: Sareen Randon MRN: [id number] Date of Birth: 1971-03-07

## 2021-06-03 ENCOUNTER — Other Ambulatory Visit: Payer: Self-pay

## 2021-06-03 DIAGNOSIS — A6004 Herpesviral vulvovaginitis: Secondary | ICD-10-CM

## 2021-06-03 MED ORDER — VALACYCLOVIR HCL 500 MG PO TABS: 500.0000 mg | ORAL_TABLET | Freq: Two times a day (BID) | ORAL | 3 refills | Status: DC

## 2021-06-04 ENCOUNTER — Other Ambulatory Visit: Payer: Self-pay

## 2021-06-04 DIAGNOSIS — A6004 Herpesviral vulvovaginitis: Secondary | ICD-10-CM

## 2021-06-04 MED ORDER — VALACYCLOVIR HCL 500 MG PO TABS: 500.0000 mg | ORAL_TABLET | Freq: Every day | ORAL | 3 refills | Status: DC

## 2021-06-19 ENCOUNTER — Other Ambulatory Visit: Payer: Self-pay

## 2021-06-19 DIAGNOSIS — A6004 Herpesviral vulvovaginitis: Secondary | ICD-10-CM

## 2021-06-19 MED ORDER — VALACYCLOVIR HCL 500 MG PO TABS: 500.0000 mg | ORAL_TABLET | Freq: Every day | ORAL | 1 refills | Status: DC

## 2021-07-01 ENCOUNTER — Other Ambulatory Visit: Payer: Self-pay

## 2021-07-01 DIAGNOSIS — A6004 Herpesviral vulvovaginitis: Secondary | ICD-10-CM

## 2021-11-04 NOTE — Progress Notes (Unsigned)
Name: Alexis Golden   MRN: [id number]    DOB: Nov 26, 1970   Date:11/05/2021       Progress Note  Subjective  Chief Complaint  Annual Exam  HPI  Patient presents for annual CPE.  Diet: she tried a lean diet earlier this year, she likes carbs but she will try to cut down  Exercise: discussed 150 minutes per week  Last Eye Exam: once a year, wears contacts  Last Dental Exam: due for follow up, about one year ago   [employer] Visit from 10/31/2020 in [facility]  AUDIT-C Score 2      Depression: Phq 9 is  negative    11/05/2021    7:50 AM 10/31/2020    9:13 AM 09/23/2019    1:46 PM 04/28/2018    2:45 PM  Depression screen PHQ 2/9  Decreased Interest 0 0 0 0  Down, Depressed, Hopeless 0 0 0 0  PHQ - 2 Score 0 0 0 0  Altered sleeping 0  0 0  Tired, decreased energy 0  0 0  Change in appetite 0  0 0  Feeling bad or failure about yourself  0  0 0  Trouble concentrating 0  0 0  Moving slowly or fidgety/restless 0  0 0  Suicidal thoughts 0  0 0  PHQ-9 Score 0  0 0  Difficult doing work/chores    Not difficult at all   Hypertension: BP Readings from Last 3 Encounters:  11/05/21 128/72  12/18/20 117/74  10/31/20 126/78   Obesity: Wt Readings from Last 3 Encounters:  11/05/21 169 lb (76.7 kg)  12/18/20 153 lb (69.4 kg)  10/31/20 154 lb (69.9 kg)   BMI Readings from Last 3 Encounters:  11/05/21 28.12 kg/m  12/18/20 25.46 kg/m  10/31/20 26.43 kg/m     Vaccines:   Tdap: up to date Shingrix: up to date  Pneumonia: N/A Flu: she gets it yearly at the local pharmacy  COVID-19: up to date   Hep C Screening: 04/28/18 STD testing and prevention (HIV/chl/gon/syphilis): 10/31/20 Intimate partner violence: negative screen  Sexual History : one partner , no problems except for some discomfort due to fibroid tumors , she worries that he may have other partners  Menstrual History/LMP/Abnormal Bleeding: peri-menopause , last cycle March 2023  Discussed  importance of follow up if any post-menopausal bleeding: not applicable  Incontinence Symptoms: negative for symptoms   Breast cancer:  - Last Mammogram: 04/25/20 - BRCA gene screening: N/A  Osteoporosis Prevention : Discussed high calcium and vitamin D supplementation, weight bearing exercises Bone density: N/A  Cervical cancer screening: 10/31/20  Skin cancer: Discussed monitoring for atypical lesions  Colorectal cancer: 12/18/20   Lung cancer:  Low Dose CT Chest recommended if Age 67-80 years, 20 pack-year currently smoking OR have quit w/in 15years. Patient does not qualify for screen   ECG: N/A  Advanced Care Planning: A voluntary discussion about advance care planning including the explanation and discussion of advance directives.  Discussed health care proxy and Living will, and the patient was able to identify a health care proxy as cousin - Bryan Lemma .  Patient does not have a living will and power of attorney of health care   Lipids: Lab Results  Component Value Date   CHOL 155 10/31/2020   CHOL 152 09/23/2019   CHOL 151 04/28/2018   Lab Results  Component Value Date   HDL 59 10/31/2020   HDL 62 09/23/2019  HDL 60 04/28/2018   Lab Results  Component Value Date   LDLCALC 70 10/31/2020   LDLCALC 69 09/23/2019   LDLCALC 73 04/28/2018   Lab Results  Component Value Date   TRIG 187 (H) 10/31/2020   TRIG 131 09/23/2019   TRIG 94 04/28/2018   Lab Results  Component Value Date   CHOLHDL 2.6 10/31/2020   CHOLHDL 2.5 09/23/2019   CHOLHDL 2.5 04/28/2018   No results found for: "LDLDIRECT"  Glucose: Glucose, Bld  Date Value Ref Range Status  10/31/2020 90 65 - 99 mg/dL Final    Comment:    .            Fasting reference interval .   09/04/2020 84 70 - 99 mg/dL Final    Comment:    Glucose reference range applies only to samples taken after fasting for at least 8 hours.  09/23/2019 72 65 - 99 mg/dL Final    Comment:    .            Fasting reference  interval .     Patient Active Problem List   Diagnosis Date Noted   History of uterine fibroid 04/28/2018   Genital herpes 04/28/2018   Pre-diabetes 03/03/2017    Past Surgical History:  Procedure Laterality Date   COLONOSCOPY WITH PROPOFOL N/A 12/18/2020   Procedure: COLONOSCOPY WITH PROPOFOL;  Surgeon: Jonathon Bellows, MD;  Location: [facility] END[facility]COPY;  Service: Gastroenterology;  Laterality: N/A;   MYOMECTOMY  04/2003:06/2012   WISDOM TOOTH EXTRACTION  85's    Family History  Problem Relation Age of Onset   Hypertension Mother    Diabetes type II Mother    Hypertension Father    Hypertension Maternal Grandmother    Diabetes Mellitus II Maternal Grandmother    Hypertension Paternal Grandmother    Stroke Paternal Grandmother    COPD Paternal Grandfather    Hypertension Maternal Aunt    COPD Paternal Aunt    Breast cancer Neg Hx     Social History   Socioeconomic History   Marital status: Divorced    Spouse name: Not on file   Number of children: 0   Years of education: Not on file   Highest education level: Bachelor's degree (e.g., BA, AB, BS)  Occupational History   Occupation: Customer Service  Tobacco Use   Smoking status: Never   Smokeless tobacco: Never  Vaping Use   Vaping Use: Never used  Substance and Sexual Activity   Alcohol use: Yes    Alcohol/week: 1.0 standard drink of alcohol    Types: 1 Glasses of wine per week    Comment: none last 24 hrs   Drug use: No   Sexual activity: Not Currently    Partners: Male    Birth control/protection: None  Other Topics Concern   Not on file  Social History Narrative   She was married for 6 years,  divorced since 03/2017    Tried to have children but unable.    Social Determinants of Health   Financial Resource Strain: Low Risk  (11/05/2021)   Overall Financial Resource Strain (CARDIA)    Difficulty of Paying Living Expenses: Not hard at all  Food Insecurity: No Food Insecurity (11/05/2021)   Hunger Vital  Sign    Worried About Running Out of Food in the Last Year: Never true    Ran Out of Food in the Last Year: Never true  Transportation Needs: No Transportation Needs (11/05/2021)   PRAPARE - Transportation  Lack of Transportation (Medical): No    Lack of Transportation (Non-Medical): No  Physical Activity: Insufficiently Active (11/05/2021)   Exercise Vital Sign    Days of Exercise per Week: 2 days    Minutes of Exercise per Session: 40 min  Stress: Stress Concern Present (11/05/2021)   [place]    Feeling of Stress : To some extent  Social Connections: Moderately Isolated (11/05/2021)   Social Connection and Isolation Panel [NHANES]    Frequency of Communication with Friends and Family: More than three times a week    Frequency of Social Gatherings with Friends and Family: Once a week    Attends Religious Services: More than 4 times per year    Active Member of [employer] or Organizations: No    Attends Archivist Meetings: Never    Marital Status: Divorced  Human resources officer Violence: Not At Risk (11/05/2021)   Humiliation, Afraid, Rape, and Kick questionnaire    Fear of Current or Ex-Partner: No    Emotionally Abused: No    Physically Abused: No    Sexually Abused: No     Current Outpatient Medications:    Cholecalciferol (VITAMIN D) 50 MCG (2000 UT) CAPS, Take 1 capsule by mouth daily., Disp: , Rfl:    Multiple Vitamins-Minerals (HAIR SKIN AND NAILS FORMULA PO), Take 1 tablet by mouth daily., Disp: , Rfl:    valACYclovir (VALTREX) 500 MG tablet, Take 1 tablet (500 mg total) by mouth daily., Disp: 90 tablet, Rfl: 1  Allergies  Allergen Reactions   Percocet [Oxycodone-Acetaminophen] Other (See Comments)    Sensitivity-made her feel uncomfortable     ROS  Constitutional: Negative for fever or weight change.  Respiratory: Negative for cough and shortness of breath.   Cardiovascular: Negative for chest  pain or palpitations.  Gastrointestinal: Negative for abdominal pain, no bowel changes.  Musculoskeletal: Negative for gait problem or joint swelling.  Skin: Negative for rash.  Neurological: Negative for dizziness or headache.  No other specific complaints in a complete review of systems (except as listed in HPI above).   Objective  Vitals:   11/05/21 0750  BP: 128/72  Pulse: 88  Resp: 16  SpO2: 98%  Weight: 169 lb (76.7 kg)  Height: _0  (1.651 m)    Body mass index is 28.12 kg/m.  Physical Exam  Constitutional: Patient appears well-developed and well-nourished. No distress.  HENT: Head: Normocephalic and atraumatic. Ears: B TMs ok, no erythema or effusion; Nose: Nose normal. Mouth/Throat: Oropharynx is clear and moist. No oropharyngeal exudate.  Eyes: Conjunctivae and EOM are normal. Pupils are equal, round, and reactive to light. No scleral icterus.  Neck: Normal range of motion. Neck supple. No JVD present. No thyromegaly present.  Cardiovascular: Normal rate, regular rhythm and normal heart sounds.  No murmur heard. No BLE edema. Pulmonary/Chest: Effort normal and breath sounds normal. No respiratory distress. Abdominal: Soft. Bowel sounds are normal, no distension. There is no tenderness. no masses Breast: lumpy on right upper outer quadrant, per patient stable,  no nipple discharge or rashes FEMALE GENITALIA:  External genitalia normal External urethra normal Vaginal vault normal without discharge or lesions Cervix normal without discharge or lesions Bimanual exam , enlarged uterus  RECTAL: not done  Musculoskeletal: Normal range of motion, no joint effusions. No gross deformities Neurological: he is alert and oriented to person, place, and time. No cranial nerve deficit. Coordination, balance, strength, speech and gait are normal.  Skin: Skin  is warm and dry. No rash noted. No erythema.  Psychiatric: Patient has a normal mood and affect. behavior is normal.  Judgment and thought content normal.   Fall Risk:    11/05/2021    7:50 AM 10/31/2020    9:13 AM 09/23/2019    1:46 PM 04/28/2018    2:13 PM  Fall Risk   Falls in the past year? 0 0 0 0  Number falls in past yr: 0 0 0   Injury with Fall? 0 0 0   Risk for fall due to : No Fall Risks     Follow up Falls prevention discussed        Functional Status Survey: Is the patient deaf or have difficulty hearing?: No Does the patient have difficulty seeing, even when wearing glasses/contacts?: No Does the patient have difficulty concentrating, remembering, or making decisions?: No Does the patient have difficulty walking or climbing stairs?: No Does the patient have difficulty dressing or bathing?: No Does the patient have difficulty doing errands alone such as visiting a doctor's office or shopping?: No   Assessment & Plan  1. Well adult exam  - Cytology - PAP - Lipid panel - Hemoglobin A1c - CBC with Differential/Platelet - COMPLETE METABOLIC PANEL WITH GFR - RPR - VITAMIN D 25 Hydroxy (Vit-D Deficiency, Fractures)  2. Breast cancer screening by mammogram  - MM 3D SCREEN BREAST BILATERAL; Future  3. Cervical cancer screening  - Cytology - PAP  4. Vitamin D deficiency  - VITAMIN D 25 Hydroxy (Vit-D Deficiency, Fractures)  5. Hyperglycemia  - Hemoglobin A1c  6. Lipid screening  - Lipid panel  7. Routine screening for STI (sexually transmitted infection)  - RPR - HIV Antibody (routine testing w rflx)  8. Screening, iron deficiency anemia  - CBC with Differential/Platelet    -USPSTF grade A and B recommendations reviewed with patient; age-appropriate recommendations, preventive care, screening tests, etc discussed and encouraged; healthy living encouraged; see AVS for patient education given to patient -Discussed importance of 150 minutes of physical activity weekly, eat two servings of fish weekly, eat one serving of tree nuts ( cashews, pistachios, pecans,  almonds.Marland Kitchen) every other day, eat 6 servings of fruit/vegetables daily and drink plenty of water and avoid sweet beverages.   -Reviewed Health Maintenance: Yes.

## 2021-11-04 NOTE — Patient Instructions (Signed)
Preventive Care 40-51 Years Old, Female Preventive care refers to lifestyle choices and visits with your health care provider that can promote health and wellness. Preventive care visits are also called wellness exams. What can I expect for my preventive care visit? Counseling Your health care provider may ask you questions about your: Medical history, including: Past medical problems. Family medical history. Pregnancy history. Current health, including: Menstrual cycle. Method of birth control. Emotional well-being. Home life and relationship well-being. Sexual activity and sexual health. Lifestyle, including: Alcohol, nicotine or tobacco, and drug use. Access to firearms. Diet, exercise, and sleep habits. Work and work environment. Sunscreen use. Safety issues such as seatbelt and bike helmet use. Physical exam Your health care provider will check your: Height and weight. These may be used to calculate your BMI (body mass index). BMI is a measurement that tells if you are at a healthy weight. Waist circumference. This measures the distance around your waistline. This measurement also tells if you are at a healthy weight and may help predict your risk of certain diseases, such as type 2 diabetes and high blood pressure. Heart rate and blood pressure. Body temperature. Skin for abnormal spots. What immunizations do I need?  Vaccines are usually given at various ages, according to a schedule. Your health care provider will recommend vaccines for you based on your age, medical history, and lifestyle or other factors, such as travel or where you work. What tests do I need? Screening Your health care provider may recommend screening tests for certain conditions. This may include: Lipid and cholesterol levels. Diabetes screening. This is done by checking your blood sugar (glucose) after you have not eaten for a while (fasting). Pelvic exam and Pap test. Hepatitis B test. Hepatitis C  test. HIV (human immunodeficiency virus) test. STI (sexually transmitted infection) testing, if you are at risk. Lung cancer screening. Colorectal cancer screening. Mammogram. Talk with your health care provider about when you should start having regular mammograms. This may depend on whether you have a family history of breast cancer. BRCA-related cancer screening. This may be done if you have a family history of breast, ovarian, tubal, or peritoneal cancers. Bone density scan. This is done to screen for osteoporosis. Talk with your health care provider about your test results, treatment options, and if necessary, the need for more tests. Follow these instructions at home: Eating and drinking  Eat a diet that includes fresh fruits and vegetables, whole grains, lean protein, and low-fat dairy products. Take vitamin and mineral supplements as recommended by your health care provider. Do not drink alcohol if: Your health care provider tells you not to drink. You are pregnant, may be pregnant, or are planning to become pregnant. If you drink alcohol: Limit how much you have to 0-1 drink a day. Know how much alcohol is in your drink. In the [place] one drink equals one 12 oz bottle of beer (355 mL), one 5 oz glass of wine (148 mL), or one 1 oz glass of hard liquor (44 mL). Lifestyle Brush your teeth every morning and night with fluoride toothpaste. Floss one time each day. Exercise for at least 30 minutes 5 or more days each week. Do not use any products that contain nicotine or tobacco. These products include cigarettes, chewing tobacco, and vaping devices, such as e-cigarettes. If you need help quitting, ask your health care provider. Do not use drugs. If you are sexually active, practice safe sex. Use a condom or other form of protection to   prevent STIs. If you do not wish to become pregnant, use a form of birth control. If you plan to become pregnant, see your health care provider for a  prepregnancy visit. Take aspirin only as told by your health care provider. Make sure that you understand how much to take and what form to take. Work with your health care provider to find out whether it is safe and beneficial for you to take aspirin daily. Find healthy ways to manage stress, such as: Meditation, yoga, or listening to music. Journaling. Talking to a trusted person. Spending time with friends and family. Minimize exposure to UV radiation to reduce your risk of skin cancer. Safety Always wear your seat belt while driving or riding in a vehicle. Do not drive: If you have been drinking alcohol. Do not ride with someone who has been drinking. When you are tired or distracted. While texting. If you have been using any mind-altering substances or drugs. Wear a helmet and other protective equipment during sports activities. If you have firearms in your house, make sure you follow all gun safety procedures. Seek help if you have been physically or sexually abused. What's next? Visit your health care provider once a year for an annual wellness visit. Ask your health care provider how often you should have your eyes and teeth checked. Stay up to date on all vaccines. This information is not intended to replace advice given to you by your health care provider. Make sure you discuss any questions you have with your health care provider. Document Revised: 09/05/2020 Document Reviewed: 09/05/2020 Elsevier Patient Education  [place].

## 2021-11-05 ENCOUNTER — Ambulatory Visit (INDEPENDENT_AMBULATORY_CARE_PROVIDER_SITE_OTHER): Payer: BC Managed Care – PPO | Admitting: Family Medicine

## 2021-11-05 ENCOUNTER — Other Ambulatory Visit (HOSPITAL_COMMUNITY)
Admission: RE | Admit: 2021-11-05 | Discharge: 2021-11-05 | Disposition: A | Discharge status: Home / Self Care | Payer: BC Managed Care – PPO | Source: Ambulatory Visit | Attending: Family Medicine | Admitting: Family Medicine

## 2021-11-05 ENCOUNTER — Encounter: Payer: Self-pay | Admitting: Family Medicine

## 2021-11-05 VITALS — BP 128/72 | HR 88 | Resp 16 | Ht 65.0 in | Wt 169.0 lb

## 2021-11-05 DIAGNOSIS — Z124 Encounter for screening for malignant neoplasm of cervix: Secondary | ICD-10-CM | POA: Insufficient documentation

## 2021-11-05 DIAGNOSIS — R739 Hyperglycemia, unspecified: Secondary | ICD-10-CM

## 2021-11-05 DIAGNOSIS — Z1231 Encounter for screening mammogram for malignant neoplasm of breast: Secondary | ICD-10-CM

## 2021-11-05 DIAGNOSIS — E559 Vitamin D deficiency, unspecified: Secondary | ICD-10-CM | POA: Diagnosis not present

## 2021-11-05 DIAGNOSIS — Z113 Encounter for screening for infections with a predominantly sexual mode of transmission: Secondary | ICD-10-CM | POA: Diagnosis not present

## 2021-11-05 DIAGNOSIS — Z0000 Encounter for general adult medical examination without abnormal findings: Secondary | ICD-10-CM | POA: Diagnosis not present

## 2021-11-05 DIAGNOSIS — Z13220 Encounter for screening for lipoid disorders: Secondary | ICD-10-CM | POA: Diagnosis not present

## 2021-11-05 DIAGNOSIS — Z1151 Encounter for screening for human papillomavirus (HPV): Secondary | ICD-10-CM | POA: Insufficient documentation

## 2021-11-05 DIAGNOSIS — Z130 Encounter for screening for diseases of the blood and blood-forming organs and certain disorders involving the immune mechanism: Secondary | ICD-10-CM

## 2021-11-06 LAB — COMPLETE METABOLIC PANEL WITH GFR
AG Ratio: 1.7 (calc) (ref 1.0–2.5)
ALT: 15 U/L (ref 6–29)
AST: 15 U/L (ref 10–35)
Albumin: 4.3 g/dL (ref 3.6–5.1)
Alkaline phosphatase (APISO): 40 U/L (ref 37–153)
BUN: 8 mg/dL (ref 7–25)
CO2: 28 mmol/L (ref 20–32)
Calcium: 9.2 mg/dL (ref 8.6–10.4)
Chloride: 105 mmol/L (ref 98–110)
Creat: 0.98 mg/dL (ref 0.50–1.03)
Globulin: 2.6 g/dL (calc) (ref 1.9–3.7)
Glucose, Bld: 114 mg/dL — ABNORMAL HIGH (ref 65–99)
Potassium: 4.2 mmol/L (ref 3.5–5.3)
Sodium: 142 mmol/L (ref 135–146)
Total Bilirubin: 0.3 mg/dL (ref 0.2–1.2)
Total Protein: 6.9 g/dL (ref 6.1–8.1)
eGFR: 70 mL/min/{1.73_m2} (ref >=60)

## 2021-11-06 LAB — CBC WITH DIFFERENTIAL/PLATELET
Absolute Monocytes: 358 cells/uL (ref 200–950)
Basophils Absolute: 32 cells/uL (ref 0–200)
Basophils Relative: 0.5 %
Eosinophils Absolute: 90 cells/uL (ref 15–500)
Eosinophils Relative: 1.4 %
HCT: 38.1 % (ref 35.0–45.0)
Hemoglobin: 13 g/dL (ref 11.7–15.5)
Lymphs Abs: 2630 cells/uL (ref 850–3900)
MCH: 30 pg (ref 27.0–33.0)
MCHC: 34.1 g/dL (ref 32.0–36.0)
MCV: 88 fL (ref 80.0–100.0)
MPV: 12.1 fL (ref 7.5–12.5)
Monocytes Relative: 5.6 %
Neutro Abs: 3290 cells/uL (ref 1500–7800)
Neutrophils Relative %: 51.4 %
Platelets: 199 10*3/uL (ref 140–400)
RBC: 4.33 10*6/uL (ref 3.80–5.10)
RDW: 11.9 % (ref 11.0–15.0)
Total Lymphocyte: 41.1 %
WBC: 6.4 10*3/uL (ref 3.8–10.8)

## 2021-11-06 LAB — HEMOGLOBIN A1C
Hgb A1c MFr Bld: 6 % of total Hgb — ABNORMAL HIGH (ref <5.7)
Mean Plasma Glucose: 126 mg/dL
eAG (mmol/L): 7 mmol/L

## 2021-11-06 LAB — LIPID PANEL
Cholesterol: 162 mg/dL (ref <200)
HDL: 46 mg/dL — ABNORMAL LOW (ref >=50)
LDL Cholesterol (Calc): 83 mg/dL (calc)
Non-HDL Cholesterol (Calc): 116 mg/dL (calc) (ref <130)
Total CHOL/HDL Ratio: 3.5 (calc) (ref <5.0)
Triglycerides: 236 mg/dL — ABNORMAL HIGH (ref <150)

## 2021-11-06 LAB — HIV ANTIBODY (ROUTINE TESTING W REFLEX): HIV 1&2 Ab, 4th Generation: NONREACTIVE

## 2021-11-06 LAB — VITAMIN D 25 HYDROXY (VIT D DEFICIENCY, FRACTURES): Vit D, 25-Hydroxy: 27 ng/mL — ABNORMAL LOW (ref 30–100)

## 2021-11-06 LAB — RPR: RPR Ser Ql: NONREACTIVE

## 2021-11-07 LAB — CYTOLOGY - PAP
Chlamydia: NEGATIVE
Comment: NEGATIVE
Comment: NEGATIVE
Comment: NEGATIVE
Comment: NORMAL
High risk HPV: POSITIVE — AB
Neisseria Gonorrhea: NEGATIVE
Trichomonas: NEGATIVE

## 2021-11-11 ENCOUNTER — Other Ambulatory Visit: Payer: Self-pay | Admitting: Family Medicine

## 2021-11-11 DIAGNOSIS — R87612 Low grade squamous intraepithelial lesion on cytologic smear of cervix (LGSIL): Secondary | ICD-10-CM

## 2021-12-26 ENCOUNTER — Ambulatory Visit
Admission: RE | Admit: 2021-12-26 | Discharge: 2021-12-26 | Disposition: A | Discharge status: Home / Self Care | Payer: BC Managed Care – PPO | Source: Ambulatory Visit | Attending: Family Medicine | Admitting: Family Medicine

## 2021-12-26 DIAGNOSIS — Z1231 Encounter for screening mammogram for malignant neoplasm of breast: Secondary | ICD-10-CM | POA: Diagnosis not present

## 2022-02-12 ENCOUNTER — Ambulatory Visit (INDEPENDENT_AMBULATORY_CARE_PROVIDER_SITE_OTHER): Payer: BC Managed Care – PPO | Admitting: Obstetrics and Gynecology

## 2022-02-12 ENCOUNTER — Other Ambulatory Visit (HOSPITAL_COMMUNITY)
Admission: RE | Admit: 2022-02-12 | Discharge: 2022-02-12 | Disposition: A | Discharge status: Home / Self Care | Payer: BC Managed Care – PPO | Source: Ambulatory Visit | Attending: Obstetrics and Gynecology | Admitting: Obstetrics and Gynecology

## 2022-02-12 ENCOUNTER — Encounter: Payer: Self-pay | Admitting: Obstetrics and Gynecology

## 2022-02-12 VITALS — BP 120/76 | HR 77 | Ht 65.0 in | Wt 178.3 lb

## 2022-02-12 DIAGNOSIS — Z3202 Encounter for pregnancy test, result negative: Secondary | ICD-10-CM | POA: Diagnosis not present

## 2022-02-12 DIAGNOSIS — Z7689 Persons encountering health services in other specified circumstances: Secondary | ICD-10-CM

## 2022-02-12 DIAGNOSIS — R87612 Low grade squamous intraepithelial lesion on cytologic smear of cervix (LGSIL): Secondary | ICD-10-CM | POA: Diagnosis not present

## 2022-02-12 DIAGNOSIS — N926 Irregular menstruation, unspecified: Secondary | ICD-10-CM

## 2022-02-12 DIAGNOSIS — B977 Papillomavirus as the cause of diseases classified elsewhere: Secondary | ICD-10-CM

## 2022-02-12 DIAGNOSIS — N879 Dysplasia of cervix uteri, unspecified: Secondary | ICD-10-CM | POA: Diagnosis not present

## 2022-02-12 LAB — POCT URINE PREGNANCY: Preg Test, Ur: NEGATIVE

## 2022-02-12 NOTE — Progress Notes (Signed)
Referring Provider:  Ancil Boozer  HPI:  Alexis Golden is a 51 y.o.  G1P0010  who presents today for evaluation and management of abnormal cervical cytology.   Patient has a history of uterine fibroids and has undergone 2 previous myomectomies.  Her menses last year were normal and regular without significant cramping or bleeding. She has not had a menstrual period since March.  Denies other postmenopausal symptoms.  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  OB History  Gravida Para Term Preterm AB Living  1 0     1 0  SAB IAB Ectopic Multiple Live Births               # Outcome Date GA Lbr Len/2nd Weight Sex Delivery Anes PTL Lv  1 AB             Past Medical History:  Diagnosis Date   Fibroids    Genital herpes     Past Surgical History:  Procedure Laterality Date   COLONOSCOPY WITH PROPOFOL N/A 12/18/2020   Procedure: COLONOSCOPY WITH PROPOFOL;  Surgeon: Jonathon Bellows, MD;  Location: [facility] ENDOSCOPY;  Service: Gastroenterology;  Laterality: N/A;   MYOMECTOMY  04/2003:06/2012   WISDOM TOOTH EXTRACTION  1990's    SOCIAL HISTORY:  Social History   Substance and Sexual Activity  Alcohol Use Yes   Alcohol/week: 1.0 standard drink of alcohol   Types: 1 Glasses of wine per week   Comment: none last 24 hrs    Social History   Substance and Sexual Activity  Drug Use No     Family History  Problem Relation Age of Onset   Hypertension Mother    Diabetes type II Mother    Hypertension Father    Hypertension Maternal Grandmother    Diabetes Mellitus II Maternal Grandmother    Hypertension Paternal Grandmother    Stroke Paternal Grandmother    COPD Paternal Grandfather    Hypertension Maternal Aunt    COPD Paternal Aunt    Breast cancer Neg Hx     ALLERGIES:  Percocet [oxycodone-acetaminophen]  She has a current medication list which includes the following prescription(s):  vitamin d, multiple vitamins-minerals, and valacyclovir.  Physical Exam: -Vitals:  BP 120/76   Pulse 77   Ht '5\' 5"'$  (1.651 m)   Wt 178 lb 4.8 oz (80.9 kg)   LMP 12/29/2021 (Approximate)   BMI 29.67 kg/m   PROCEDURE: Colposcopy performed with 4% acetic acid after verbal consent obtained                           -Aceto-white Lesions Location(s): See above              -Biopsy performed at 11 o'clock               -ECC indicated and performed: Yes.       -Biopsy sites made hemostatic with pressure and Monsel's solution   -Satisfactory colposcopy: Yes.      -Evidence of Invasive cervical CA :  NO  ASSESSMENT:  Alexis Golden is a 51 y.o. G1P0010 here for  1. Establishing care with new doctor, encounter for   2. 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 Irregular bleeding   .  PLAN: 1.  I discussed the grading system of pap smears and  HPV high risk viral types.  We will discuss management after colpo results return. 2.  She is also interested in discussing menopause and possible HRT at the same visit as her colposcopy follow-up.  No orders of the defined types were placed in this encounter.          F/U  Return in about 2 weeks (around 02/26/2022). I spent 22 minutes involved in the care of this patient preparing to see the patient by obtaining and reviewing her medical history (including labs, imaging tests and prior procedures), documenting clinical information in the electronic health record (EHR), counseling and coordinating care plans, writing and sending prescriptions, ordering tests or procedures and in direct communicating with the patient and medical staff discussing pertinent items from her history and physical exam.  Jeannie Fend ,MD 02/12/2022,8:57 AM

## 2022-02-12 NOTE — Progress Notes (Signed)
Patient presents today for colposcopy due to recent abnormal pap smear, lgsil/hpv+. She states believing she had a colposcopy years ago but unsure of results. Patient also reports a history of fibroids, denies abnormal bleeding.

## 2022-02-14 LAB — SURGICAL PATHOLOGY

## 2022-02-17 ENCOUNTER — Encounter: Payer: Self-pay | Admitting: Obstetrics and Gynecology

## 2022-02-27 IMAGING — MG MM DIGITAL DIAGNOSTIC UNILAT*R* W/ TOMO W/ CAD
6 series · 6 of 18 positions shown · non-contrast
Comparison: Previous exam(s).

CLINICAL DATA: 50-year-old female with a physician palpated right
breast lump.

EXAM:
DIGITAL DIAGNOSTIC UNILATERAL RIGHT MAMMOGRAM WITH TOMOSYNTHESIS AND
CAD; ULTRASOUND RIGHT BREAST LIMITED
TECHNIQUE: Right digital diagnostic mammography and breast tomosynthesis was
performed. The images were evaluated with computer-aided detection.;
Targeted ultrasound examination of the right breast was performed

[R MLO synth-2D]
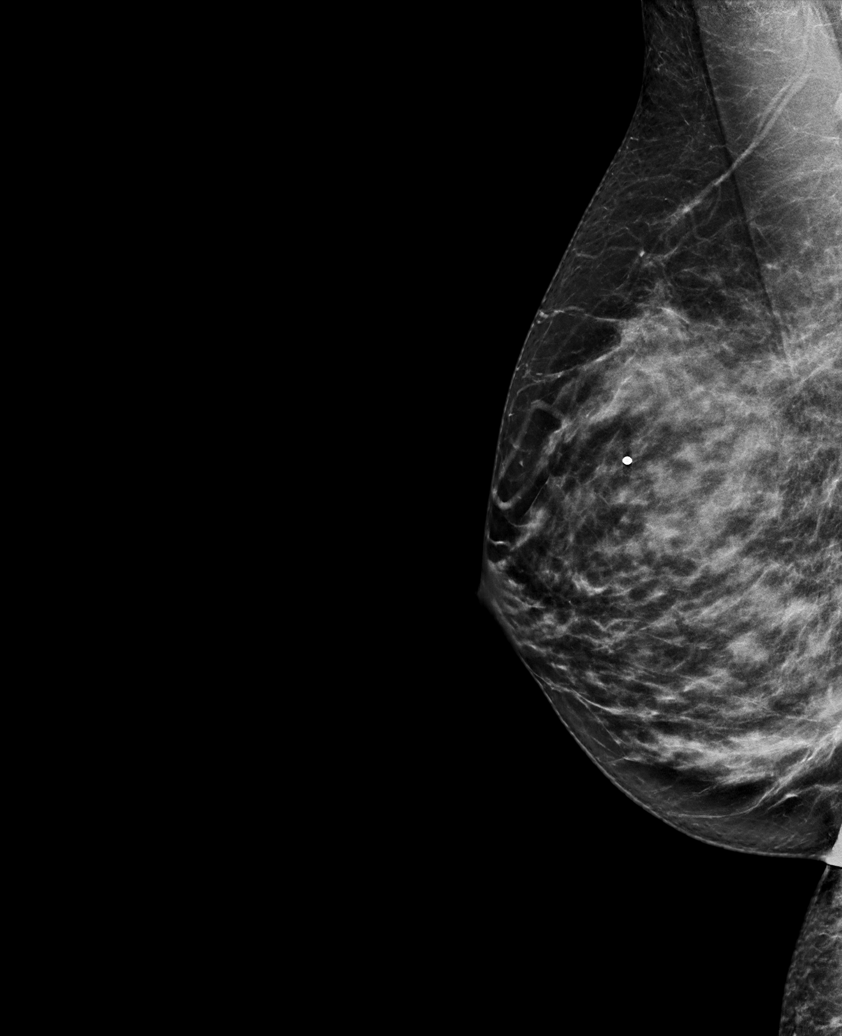

[R CC synth-2D]
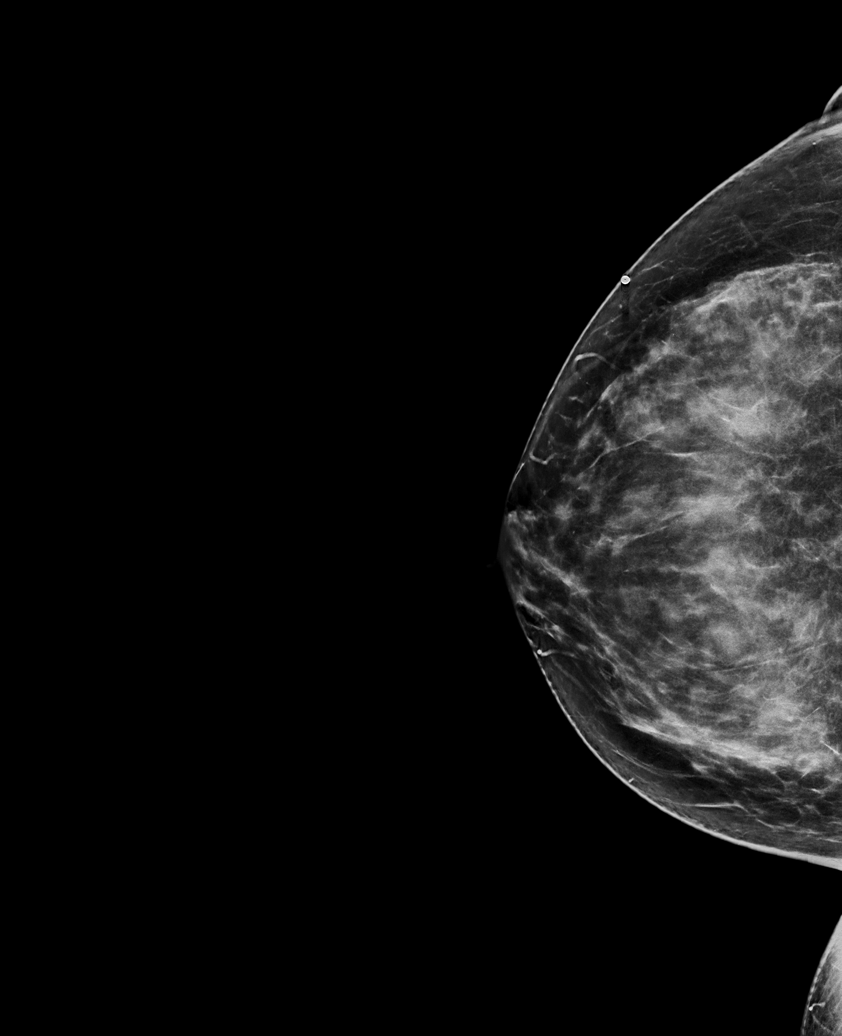

[R TAN synth-2D]
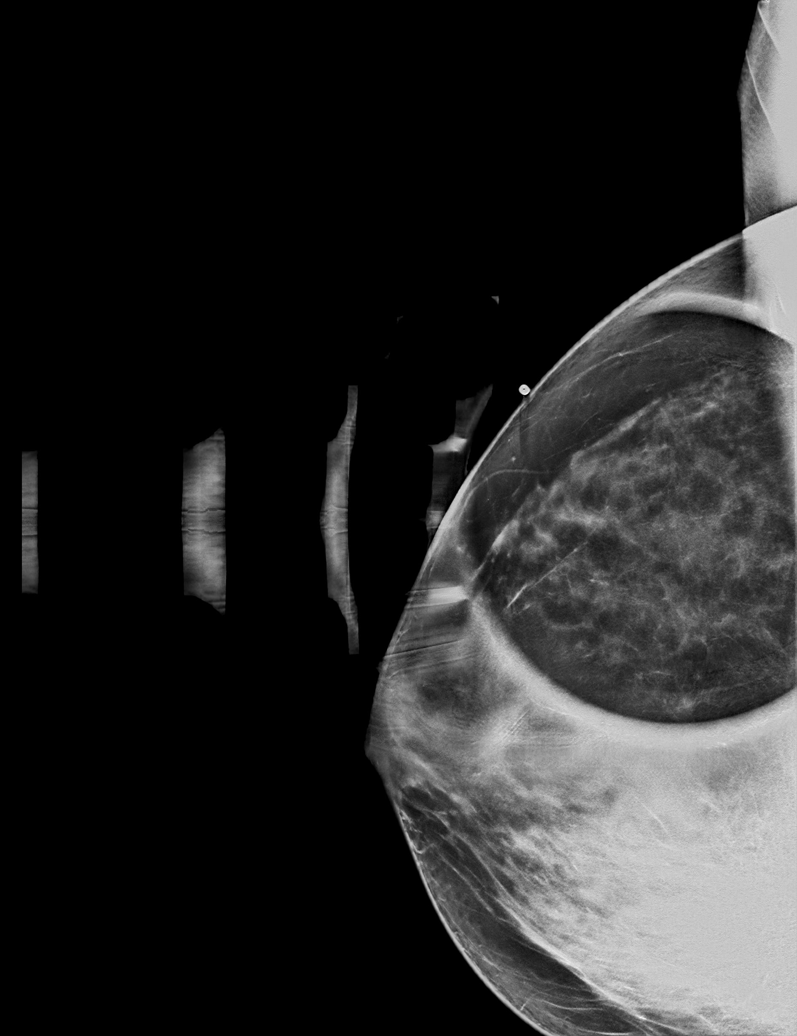

[R CC tomo · tomo slice 35/69.0]
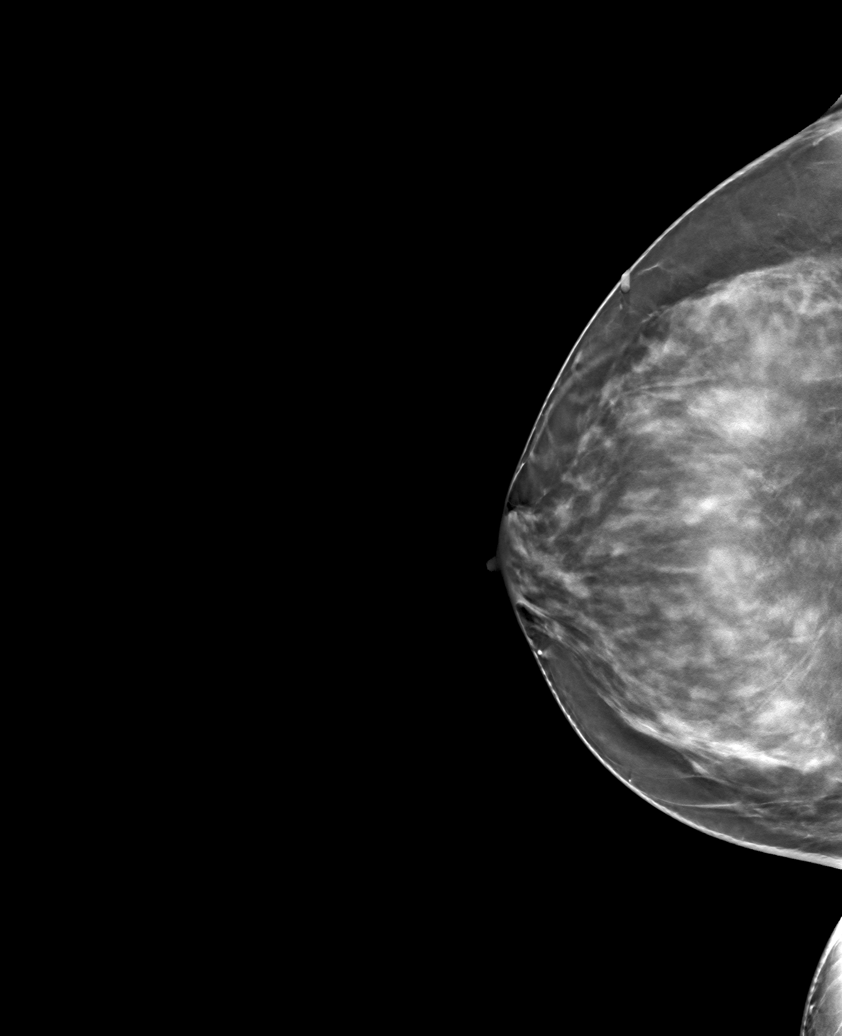

[R TAN tomo · tomo slice 35/69.0]
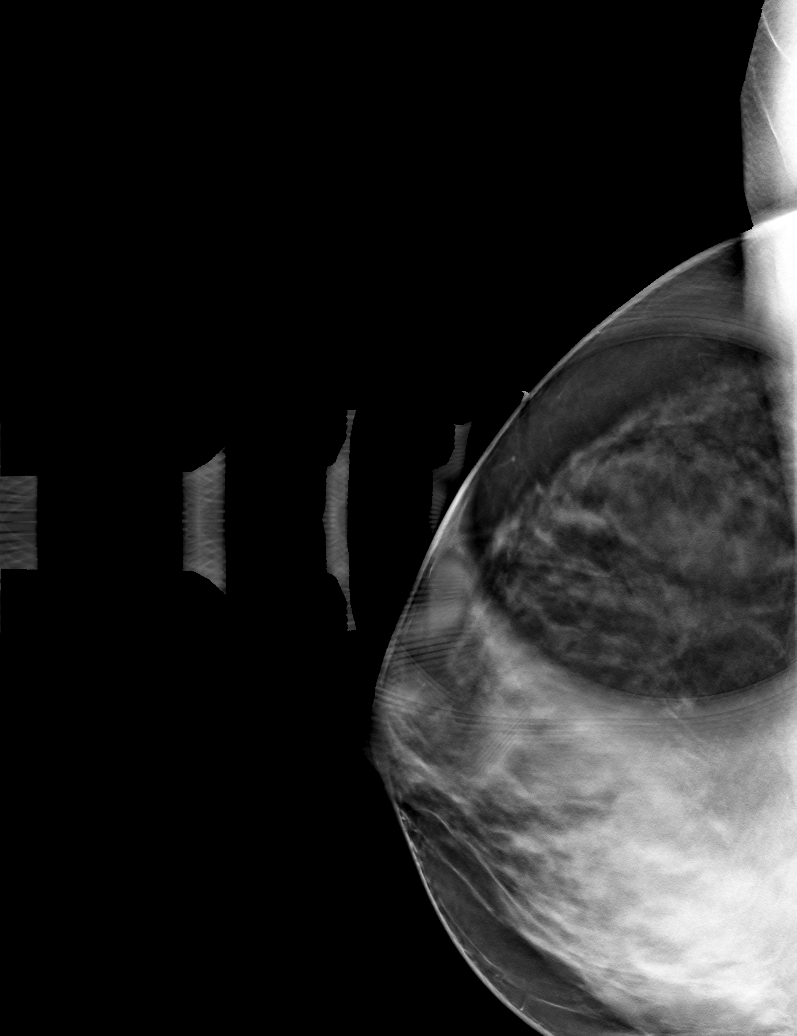

[R MLO tomo · tomo slice 33/65.0]
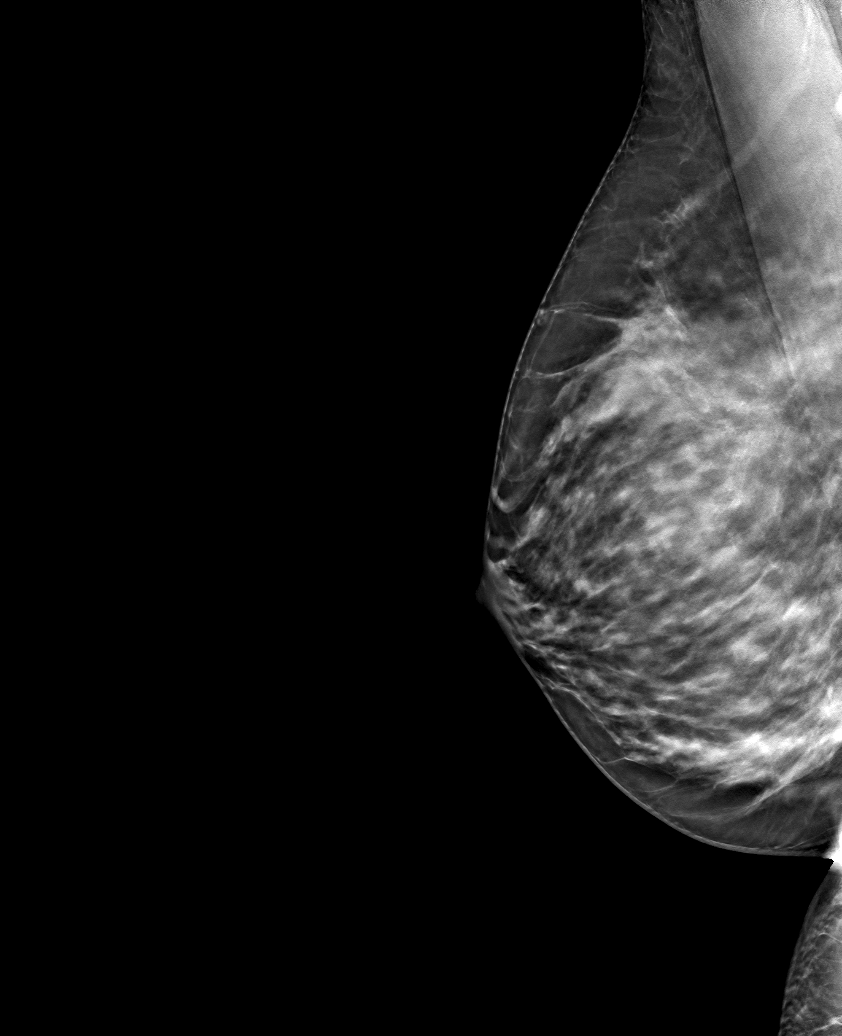

[6 of 18 positions shown; findings below may reference images not displayed]

ACR Breast Density Category c: The breast tissue is heterogeneously
dense, which may obscure small masses.
FINDINGS: A radiopaque BB was placed at the site of the patient's right breast
lump in the lateral aspect. No focal or suspicious mammographic
findings are seen deep to the radiopaque BB or within the remainder
of the right breast.

On physical exam, I palpate no suspicious lumps in the lateral right
breast.

Targeted ultrasound is performed, showing dense fibroglandular
tissue without focal or suspicious sonographic abnormality.
Evaluation along the lower outer quadrant was performed.
IMPRESSION: No suspicious mammographic or sonographic findings within the right
breast.

RECOMMENDATION:
1. Clinical follow-up recommended for the palpable area of concern
in the right breast. Any further workup should be based on clinical
grounds.
2. Routine annual screening due in April 2021.

I have discussed the findings and recommendations with the patient.
If applicable, a reminder letter will be sent to the patient
regarding the next appointment.

BI-RADS CATEGORY  1: Negative.

## 2022-03-04 ENCOUNTER — Encounter: Payer: Self-pay | Admitting: Obstetrics and Gynecology

## 2022-03-04 ENCOUNTER — Ambulatory Visit (INDEPENDENT_AMBULATORY_CARE_PROVIDER_SITE_OTHER): Payer: BC Managed Care – PPO | Admitting: Obstetrics and Gynecology

## 2022-03-04 VITALS — BP 136/85 | HR 101 | Ht 65.0 in | Wt 179.0 lb

## 2022-03-04 DIAGNOSIS — B977 Papillomavirus as the cause of diseases classified elsewhere: Secondary | ICD-10-CM

## 2022-03-04 DIAGNOSIS — N926 Irregular menstruation, unspecified: Secondary | ICD-10-CM

## 2022-03-04 DIAGNOSIS — R87612 Low grade squamous intraepithelial lesion on cytologic smear of cervix (LGSIL): Secondary | ICD-10-CM | POA: Diagnosis not present

## 2022-03-04 NOTE — Progress Notes (Signed)
Patient presents today to discuss recent colposcopy findings. Since her last visit she has also starting her cycle which has been on and off over the last year. Patient reports heavy bleeding with mild cramping, denies menopausal symptoms. No additional concerns.

## 2022-03-04 NOTE — Progress Notes (Signed)
HPI:      [name] is a 51 y.o. G1P0010 who LMP was Patient's last menstrual period was 03/01/2022 (approximate).  Subjective:   She presents today for follow-up of her colposcopy.  This shows no significant abnormality.  Reviewing the pictures she only had a very small area of acetowhite change. Additionally patient has had a menstrual period now making only 2 menses with some irregular bleeding in the last year.  She reports no menopausal symptoms.    Hx: The following portions of the patient's history were reviewed and updated as appropriate:             She  has a past medical history of Fibroids and Genital herpes. She does not have any pertinent problems on file. She  has a past surgical history that includes Myomectomy (04/2003:06/2012); Wisdom tooth extraction (1990's); and Colonoscopy with propofol (N/A, 12/18/2020). Her family history includes COPD in her paternal aunt and paternal grandfather; Diabetes Mellitus II in her maternal grandmother; Diabetes type II in her mother; Hypertension in her father, maternal aunt, maternal grandmother, mother, and paternal grandmother; Stroke in her paternal grandmother. She  reports that she has never smoked. She has never used smokeless tobacco. She reports current alcohol use of about 1.0 standard drink of alcohol per week. She reports that she does not use drugs. She has a current medication list which includes the following prescription(s): vitamin d, multiple vitamins-minerals, and valacyclovir. She is allergic to percocet [oxycodone-acetaminophen].       Review of Systems:  Review of Systems  Constitutional: Denied constitutional symptoms, night sweats, recent illness, fatigue, fever, insomnia and weight loss.  Eyes: Denied eye symptoms, eye pain, photophobia, vision change and visual disturbance.  Ears/Nose/Throat/Neck: Denied ear, nose, throat or neck symptoms, hearing loss, nasal discharge, sinus congestion and sore throat.   Cardiovascular: Denied cardiovascular symptoms, arrhythmia, chest pain/pressure, edema, exercise intolerance, orthopnea and palpitations.  Respiratory: Denied pulmonary symptoms, asthma, pleuritic pain, productive sputum, cough, dyspnea and wheezing.  Gastrointestinal: Denied, gastro-esophageal reflux, melena, nausea and vomiting.  Genitourinary: Denied genitourinary symptoms including symptomatic vaginal discharge, pelvic relaxation issues, and urinary complaints.  Musculoskeletal: Denied musculoskeletal symptoms, stiffness, swelling, muscle weakness and myalgia.  Dermatologic: Denied dermatology symptoms, rash and scar.  Neurologic: Denied neurology symptoms, dizziness, headache, neck pain and syncope.  Psychiatric: Denied psychiatric symptoms, anxiety and depression.  Endocrine: Denied endocrine symptoms including hot flashes and night sweats.   Meds:   Current Outpatient Medications on File Prior to Visit  Medication Sig Dispense Refill   Cholecalciferol (VITAMIN D) 50 MCG (2000 UT) CAPS Take 1 capsule by mouth daily.     Multiple Vitamins-Minerals (HAIR SKIN AND NAILS FORMULA PO) Take 1 tablet by mouth daily.     valACYclovir (VALTREX) 500 MG tablet Take 1 tablet (500 mg total) by mouth daily. 90 tablet 1   No current facility-administered medications on file prior to visit.      Objective:     Vitals:   03/04/22 1524  BP: 136/85  Pulse: (!) 101   Filed Weights   03/04/22 1524  Weight: 179 lb (81.2 kg)                        Assessment:    G1P0010 Patient Active Problem List   Diagnosis Date Noted   History of uterine fibroid 04/28/2018   Genital herpes 04/28/2018   Pre-diabetes 03/03/2017     1. 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 Irregular menstrual cycle     Normal colposcopy  Irregular bleeding   Plan:            1.  FSH to determine menopausal status.  This will determine if she has postmenopausal bleeding or premenopausal  bleeding.  She may well be perimenopausal.  Workup as necessary.  2.  Plan repeat Pap in 1 year with HPV.  Discussed findings in detail with the patient Orders Orders Placed This Encounter  Procedures   [place]    No orders of the defined types were placed in this encounter.     F/U  No follow-ups on file. I spent 22 minutes involved in the care of this patient preparing to see the patient by obtaining and reviewing her medical history (including labs, imaging tests and prior procedures), documenting clinical information in the electronic health record (EHR), counseling and coordinating care plans, writing and sending prescriptions, ordering tests or procedures and in direct communicating with the patient and medical staff discussing pertinent items from her history and physical exam.  Finis Bud, M.D. 03/04/2022 4:16 PM

## 2022-03-05 LAB — FOLLICLE STIMULATING HORMONE: FSH: 17.2 m[IU]/mL

## 2022-06-06 ENCOUNTER — Other Ambulatory Visit: Payer: Self-pay | Admitting: Family Medicine

## 2022-06-06 DIAGNOSIS — A6004 Herpesviral vulvovaginitis: Secondary | ICD-10-CM

## 2022-11-10 NOTE — Progress Notes (Unsigned)
Name: Alexis Golden   MRN: [id number]    DOB: 05-Oct-1970   Date:11/11/2022       Progress Note  Subjective  Chief Complaint  Annual Exam  HPI  Patient presents for annual CPE.  Diet: she has been under more stress, lost her job in April 2024, boyfriend had a stroke and she found out that he was cheating on her.  Exercise: walking a few times a week for about 30-45 minutes   Last Eye Exam: up to date  Last Dental Exam: she is due for an exam   Flowsheet Row Office Visit from 11/11/2022 in [facility]  AUDIT-C Score 2      Depression: Phq 9 is  positive    11/11/2022    7:54 AM 11/05/2021    7:50 AM 10/31/2020    9:13 AM 09/23/2019    1:46 PM 04/28/2018    2:45 PM  Depression screen PHQ 2/9  Decreased Interest 1 0 0 0 0  Down, Depressed, Hopeless 1 0 0 0 0  PHQ - 2 Score 2 0 0 0 0  Altered sleeping 0 0  0 0  Tired, decreased energy 0 0  0 0  Change in appetite 0 0  0 0  Feeling bad or failure about yourself  0 0  0 0  Trouble concentrating 0 0  0 0  Moving slowly or fidgety/restless 0 0  0 0  Suicidal thoughts 0 0  0 0  PHQ-9 Score 2 0  0 0  Difficult doing work/chores     Not difficult at all   Hypertension: BP Readings from Last 3 Encounters:  11/11/22 128/70  03/04/22 136/85  02/12/22 120/76   Obesity: Wt Readings from Last 3 Encounters:  11/11/22 167 lb (75.8 kg)  03/04/22 179 lb (81.2 kg)  02/12/22 178 lb 4.8 oz (80.9 kg)   BMI Readings from Last 3 Encounters:  11/11/22 27.79 kg/m  03/04/22 29.79 kg/m  02/12/22 29.67 kg/m     Vaccines:   HPV: N/A Tdap: up to date Shingrix: we will get records  Pneumonia: N/A Flu: discussed yearly  COVID-19: up to date   Hep C Screening: 04/28/18 STD testing and prevention (HIV/chl/gon/syphilis): 11/05/21 Intimate partner violence: negative screen  Sexual History : not sexually active since July 2024  Menstrual History/LMP/Abnormal Bleeding: perimenopausal Last full cycle 04/2022 but still  spotting intermittently, LMP last week  Discussed importance of follow up if any post-menopausal bleeding: N/A Incontinence Symptoms: negative for symptoms   Breast cancer:  - Last Mammogram: 12/26/21 - BRCA gene screening: N/A  Osteoporosis Prevention : Discussed high calcium and vitamin D supplementation, weight bearing exercises Bone density: N/A   Cervical cancer screening: 11/05/21  Skin cancer: Discussed monitoring for atypical lesions  Colorectal cancer: 11/17/20   Lung cancer:  Low Dose CT Chest recommended if Age 66-80 years, 20 pack-year currently smoking OR have quit w/in 15years. Patient does not qualify for screen   ECG: N/A  Advanced Care Planning: A voluntary discussion about advance care planning including the explanation and discussion of advance directives.  Discussed health care proxy and Living will, and the patient was able to identify a health care proxy as cousin - Luvenia Heller .  Patient does not have a living will and power of attorney of health care   Lipids: Lab Results  Component Value Date   CHOL 162 11/05/2021   CHOL 155 10/31/2020   CHOL 152 09/23/2019   Lab  Results  Component Value Date   HDL 46 (L) 11/05/2021   HDL 59 10/31/2020   HDL 62 09/23/2019   Lab Results  Component Value Date   LDLCALC 83 11/05/2021   LDLCALC 70 10/31/2020   LDLCALC 69 09/23/2019   Lab Results  Component Value Date   TRIG 236 (H) 11/05/2021   TRIG 187 (H) 10/31/2020   TRIG 131 09/23/2019   Lab Results  Component Value Date   CHOLHDL 3.5 11/05/2021   CHOLHDL 2.6 10/31/2020   CHOLHDL 2.5 09/23/2019   No results found for: "LDLDIRECT"  Glucose: Glucose, Bld  Date Value Ref Range Status  11/05/2021 114 (H) 65 - 99 mg/dL Final    Comment:    .            Fasting reference interval . For someone without known diabetes, a glucose value between 100 and 125 mg/dL is consistent with prediabetes and should be confirmed with a follow-up test. .    10/31/2020 90 65 - 99 mg/dL Final    Comment:    .            Fasting reference interval .   09/04/2020 84 70 - 99 mg/dL Final    Comment:    Glucose reference range applies only to samples taken after fasting for at least 8 hours.    Patient Active Problem List   Diagnosis Date Noted   History of uterine fibroid 04/28/2018   Genital herpes 04/28/2018   Pre-diabetes 03/03/2017    Past Surgical History:  Procedure Laterality Date   COLONOSCOPY WITH PROPOFOL N/A 12/18/2020   Procedure: COLONOSCOPY WITH PROPOFOL;  Surgeon: Wyline Mood, MD;  Location: [facility] ENDOSCOPY;  Service: Gastroenterology;  Laterality: N/A;   MYOMECTOMY  04/2003:06/2012   WISDOM TOOTH EXTRACTION  1990's    Family History  Problem Relation Age of Onset   Hypertension Mother    Diabetes type II Mother    Hypertension Father    Hypertension Maternal Grandmother    Diabetes Mellitus II Maternal Grandmother    Hypertension Paternal Grandmother    Stroke Paternal Grandmother    COPD Paternal Grandfather    Hypertension Maternal Aunt    COPD Paternal Aunt    Breast cancer Neg Hx     Social History   Socioeconomic History   Marital status: Divorced    Spouse name: Not on file   Number of children: 0   Years of education: Not on file   Highest education level: Bachelor's degree (e.g., BA, AB, BS)  Occupational History   Occupation: Customer Service  Tobacco Use   Smoking status: Never   Smokeless tobacco: Never  Vaping Use   Vaping status: Never Used  Substance and Sexual Activity   Alcohol use: Yes    Alcohol/week: 1.0 standard drink of alcohol    Types: 1 Glasses of wine per week    Comment: none last 24 hrs   Drug use: No   Sexual activity: Yes    Partners: Male    Birth control/protection: None  Other Topics Concern   Not on file  Social History Narrative   She was married for 6 years,  divorced since 03/2017    Tried to have children but unable.    Social Determinants of Health    Financial Resource Strain: Low Risk  (11/11/2022)   Overall Financial Resource Strain (CARDIA)    Difficulty of Paying Living Expenses: Not hard at all  Food Insecurity: No Food Insecurity (11/11/2022)  Hunger Vital Sign    Worried About Running Out of Food in the Last Year: Never true    Ran Out of Food in the Last Year: Never true  Transportation Needs: No Transportation Needs (11/11/2022)   PRAPARE - Administrator, Civil Service (Medical): No    Lack of Transportation (Non-Medical): No  Physical Activity: Insufficiently Active (11/11/2022)   Exercise Vital Sign    Days of Exercise per Week: 3 days    Minutes of Exercise per Session: 30 min  Stress: Stress Concern Present (11/11/2022)   [employer] of Occupational Health - Occupational Stress Questionnaire    Feeling of Stress : Rather much  Social Connections: Socially Isolated (11/11/2022)   Social Connection and Isolation Panel [NHANES]    Frequency of Communication with Friends and Family: More than three times a week    Frequency of Social Gatherings with Friends and Family: Once a week    Attends Religious Services: Never    Database administrator or Organizations: No    Attends Banker Meetings: Never    Marital Status: Divorced  Catering manager Violence: Not At Risk (11/11/2022)   Humiliation, Afraid, Rape, and Kick questionnaire    Fear of Current or Ex-Partner: No    Emotionally Abused: No    Physically Abused: No    Sexually Abused: No     Current Outpatient Medications:    Cholecalciferol (VITAMIN D) 50 MCG (2000 UT) CAPS, Take 1 capsule by mouth daily., Disp: , Rfl:    Multiple Vitamins-Minerals (HAIR SKIN AND NAILS FORMULA PO), Take 1 tablet by mouth daily., Disp: , Rfl:    valACYclovir (VALTREX) 500 MG tablet, TAKE 1 TABLET(500 MG) BY MOUTH EVERY DAY, Disp: 90 tablet, Rfl: 0  Allergies  Allergen Reactions   Percocet [Oxycodone-Acetaminophen] Other (See Comments)     Sensitivity-made her feel uncomfortable     ROS  Constitutional: Negative for fever , positive for weight change.  Respiratory: Negative for cough and shortness of breath.   Cardiovascular: Negative for chest pain or palpitations.  Gastrointestinal: Negative for abdominal pain, no bowel changes.  Musculoskeletal: Negative for gait problem or joint swelling.  Skin: Negative for rash.  Neurological: Negative for dizziness or headache.  No other specific complaints in a complete review of systems (except as listed in HPI above).   Objective  Vitals:   11/11/22 0754  BP: 128/70  Pulse: 85  Resp: 16  SpO2: 97%  Weight: 167 lb (75.8 kg)  Height: 5\' 5"  (1.651 m)    Body mass index is 27.79 kg/m.  Physical Exam  Constitutional: Patient appears well-developed and well-nourished. No distress.  HENT: Head: Normocephalic and atraumatic. Ears: B TMs ok, no erythema or effusion; Nose: Nose normal. Mouth/Throat: Oropharynx is clear and moist. No oropharyngeal exudate.  Eyes: Conjunctivae and EOM are normal. Pupils are equal, round, and reactive to light. No scleral icterus.  Neck: Normal range of motion. Neck supple. No JVD present. No thyromegaly present.  Cardiovascular: Normal rate, regular rhythm and normal heart sounds.  No murmur heard. No BLE edema. Pulmonary/Chest: Effort normal and breath sounds normal. No respiratory distress. Abdominal: Soft. Bowel sounds are normal, no distension. There is no tenderness. no masses Breast: no lumps or masses, no nipple discharge or rashes FEMALE GENITALIA:  Not done  RECTAL: not done  Musculoskeletal: Normal range of motion, no joint effusions. No gross deformities Neurological: he is alert and oriented to person, place, and time. No cranial  nerve deficit. Coordination, balance, strength, speech and gait are normal.  Skin: Skin is warm and dry. No rash noted. No erythema.  Psychiatric: Patient has a normal mood and affect. behavior is  normal. Judgment and thought content normal.   Fall Risk:    11/11/2022    7:54 AM 11/05/2021    7:50 AM 10/31/2020    9:13 AM 09/23/2019    1:46 PM 04/28/2018    2:13 PM  Fall Risk   Falls in the past year? 0 0 0 0 0  Number falls in past yr: 0 0 0 0   Injury with Fall? 0 0 0 0   Risk for fall due to : No Fall Risks No Fall Risks     Follow up Falls prevention discussed Falls prevention discussed        Functional Status Survey: Is the patient deaf or have difficulty hearing?: No Does the patient have difficulty seeing, even when wearing glasses/contacts?: No Does the patient have difficulty concentrating, remembering, or making decisions?: No Does the patient have difficulty walking or climbing stairs?: No Does the patient have difficulty dressing or bathing?: No Does the patient have difficulty doing errands alone such as visiting a doctor's office or shopping?: No   Assessment & Plan  1. Well adult exam  - Lipid panel - CBC with Differential/Platelet - COMPLETE METABOLIC PANEL WITH GFR - Hemoglobin A1c - HIV Antibody (routine testing w rflx) - RPR - VITAMIN D 25 Hydroxy (Vit-D Deficiency, Fractures)  2. Stress  She is going to see a therapist this week   3. Vitamin D deficiency  - VITAMIN D 25 Hydroxy (Vit-D Deficiency, Fractures)  4. Hyperglycemia  - Hemoglobin A1c  5. Screening, iron deficiency anemia  - CBC with Differential/Platelet  6. Routine screening for STI (sexually transmitted infection)  - HIV Antibody (routine testing w rflx) - RPR - Cervicovaginal ancillary only  7. Dyslipidemia  - Lipid panel  8. Breast cancer screening by mammogram  - MM 3D SCREENING MAMMOGRAM BILATERAL BREAST; Future  9. Herpes simplex vulvovaginitis  - valACYclovir (VALTREX) 500 MG tablet; Take 1 tablet (500 mg total) by mouth daily.  Dispense: 90 tablet; Refill: 3    -USPSTF grade A and B recommendations reviewed with patient; age-appropriate recommendations,  preventive care, screening tests, etc discussed and encouraged; healthy living encouraged; see AVS for patient education given to patient -Discussed importance of 150 minutes of physical activity weekly, eat two servings of fish weekly, eat one serving of tree nuts ( cashews, pistachios, pecans, almonds.Marland Kitchen) every other day, eat 6 servings of fruit/vegetables daily and drink plenty of water and avoid sweet beverages.   -Reviewed Health Maintenance: Yes.

## 2022-11-11 ENCOUNTER — Other Ambulatory Visit (HOSPITAL_COMMUNITY)
Admission: RE | Admit: 2022-11-11 | Discharge: 2022-11-11 | Disposition: A | Discharge status: Home / Self Care | Payer: Medicaid Other | Source: Ambulatory Visit | Attending: Family Medicine | Admitting: Family Medicine

## 2022-11-11 ENCOUNTER — Encounter: Payer: Self-pay | Admitting: Family Medicine

## 2022-11-11 ENCOUNTER — Ambulatory Visit (INDEPENDENT_AMBULATORY_CARE_PROVIDER_SITE_OTHER): Payer: Medicaid Other | Admitting: Family Medicine

## 2022-11-11 VITALS — BP 128/70 | HR 85 | Resp 16 | Ht 65.0 in | Wt 167.0 lb

## 2022-11-11 DIAGNOSIS — Z113 Encounter for screening for infections with a predominantly sexual mode of transmission: Secondary | ICD-10-CM

## 2022-11-11 DIAGNOSIS — A6004 Herpesviral vulvovaginitis: Secondary | ICD-10-CM

## 2022-11-11 DIAGNOSIS — F439 Reaction to severe stress, unspecified: Secondary | ICD-10-CM | POA: Diagnosis not present

## 2022-11-11 DIAGNOSIS — Z0000 Encounter for general adult medical examination without abnormal findings: Secondary | ICD-10-CM

## 2022-11-11 DIAGNOSIS — Z130 Encounter for screening for diseases of the blood and blood-forming organs and certain disorders involving the immune mechanism: Secondary | ICD-10-CM

## 2022-11-11 DIAGNOSIS — E785 Hyperlipidemia, unspecified: Secondary | ICD-10-CM

## 2022-11-11 DIAGNOSIS — E559 Vitamin D deficiency, unspecified: Secondary | ICD-10-CM

## 2022-11-11 DIAGNOSIS — R739 Hyperglycemia, unspecified: Secondary | ICD-10-CM

## 2022-11-11 DIAGNOSIS — Z1231 Encounter for screening mammogram for malignant neoplasm of breast: Secondary | ICD-10-CM

## 2022-11-11 MED ORDER — VALACYCLOVIR HCL 500 MG PO TABS
500.0000 mg | ORAL_TABLET | Freq: Every day | ORAL | 3 refills | Status: DC
Start: 2022-11-11 — End: 2023-11-24

## 2022-11-12 LAB — COMPLETE METABOLIC PANEL WITH GFR
AG Ratio: 1.8 (calc) (ref 1.0–2.5)
ALT: 19 U/L (ref 6–29)
AST: 15 U/L (ref 10–35)
Albumin: 4.6 g/dL (ref 3.6–5.1)
Alkaline phosphatase (APISO): 45 U/L (ref 37–153)
BUN: 12 mg/dL (ref 7–25)
CO2: 27 mmol/L (ref 20–32)
Calcium: 9.8 mg/dL (ref 8.6–10.4)
Chloride: 104 mmol/L (ref 98–110)
Creat: 0.95 mg/dL (ref 0.50–1.03)
Globulin: 2.5 g/dL (ref 1.9–3.7)
Glucose, Bld: 125 mg/dL — ABNORMAL HIGH (ref 65–99)
Potassium: 4.2 mmol/L (ref 3.5–5.3)
Sodium: 141 mmol/L (ref 135–146)
Total Bilirubin: 0.3 mg/dL (ref 0.2–1.2)
Total Protein: 7.1 g/dL (ref 6.1–8.1)
eGFR: 72 mL/min/{1.73_m2} (ref >=60)

## 2022-11-12 LAB — LIPID PANEL
Cholesterol: 146 mg/dL (ref <200)
HDL: 48 mg/dL — ABNORMAL LOW (ref >=50)
LDL Cholesterol (Calc): 74 mg/dL
Non-HDL Cholesterol (Calc): 98 mg/dL (ref <130)
Total CHOL/HDL Ratio: 3 (calc) (ref <5.0)
Triglycerides: 161 mg/dL — ABNORMAL HIGH (ref <150)

## 2022-11-12 LAB — CBC WITH DIFFERENTIAL/PLATELET
Absolute Monocytes: 359 {cells}/uL (ref 200–950)
Basophils Absolute: 40 cells/uL (ref 0–200)
Basophils Relative: 0.7 %
Eosinophils Absolute: 80 {cells}/uL (ref 15–500)
Eosinophils Relative: 1.4 %
HCT: 40.4 % (ref 35.0–45.0)
Hemoglobin: 13.2 g/dL (ref 11.7–15.5)
Lymphs Abs: 2782 cells/uL (ref 850–3900)
MCH: 29.3 pg (ref 27.0–33.0)
MCHC: 32.7 g/dL (ref 32.0–36.0)
MCV: 89.6 fL (ref 80.0–100.0)
MPV: 12.1 fL (ref 7.5–12.5)
Monocytes Relative: 6.3 %
Neutro Abs: 2440 {cells}/uL (ref 1500–7800)
Neutrophils Relative %: 42.8 %
Platelets: 220 10*3/uL (ref 140–400)
RBC: 4.51 10*6/uL (ref 3.80–5.10)
RDW: 12.5 % (ref 11.0–15.0)
Total Lymphocyte: 48.8 %
WBC: 5.7 10*3/uL (ref 3.8–10.8)

## 2022-11-12 LAB — CERVICOVAGINAL ANCILLARY ONLY
Chlamydia: NEGATIVE
Comment: NEGATIVE
Comment: NORMAL
Neisseria Gonorrhea: NEGATIVE

## 2022-11-12 LAB — HEMOGLOBIN A1C
Hgb A1c MFr Bld: 6.8 %{Hb} — ABNORMAL HIGH (ref <5.7)
Mean Plasma Glucose: 148 mg/dL
eAG (mmol/L): 8.2 mmol/L

## 2022-11-12 LAB — HIV ANTIBODY (ROUTINE TESTING W REFLEX): HIV 1&2 Ab, 4th Generation: NONREACTIVE

## 2022-11-12 LAB — VITAMIN D 25 HYDROXY (VIT D DEFICIENCY, FRACTURES): Vit D, 25-Hydroxy: 33 ng/mL (ref 30–100)

## 2022-11-12 LAB — RPR: RPR Ser Ql: NONREACTIVE

## 2022-12-29 ENCOUNTER — Ambulatory Visit
Admission: RE | Admit: 2022-12-29 | Discharge: 2022-12-29 | Disposition: A | Discharge status: Home / Self Care | Payer: Medicaid Other | Source: Ambulatory Visit | Attending: Family Medicine | Admitting: Family Medicine

## 2022-12-29 DIAGNOSIS — Z1231 Encounter for screening mammogram for malignant neoplasm of breast: Secondary | ICD-10-CM | POA: Diagnosis present

## 2022-12-31 ENCOUNTER — Encounter: Payer: Self-pay | Admitting: Family Medicine

## 2023-02-18 NOTE — Progress Notes (Signed)
Name: Alexis Golden   MRN: [id number]    DOB: 05-27-1970   Date:02/24/2023       Progress Note  Subjective  Chief Complaint  Chief Complaint  Patient presents with   Medical Management of Chronic Issues    Pt states stress has been better but still want to proceed with EKG    HPI  Discussed the use of AI scribe software for clinical note transcription with the patient, who gave verbal consent to proceed.  History of Present Illness   The patient, previously diagnosed with prediabetes, presented for a follow-up visit after a recent physical examination revealed an elevated A1c of 6.8%. The patient reported significant lifestyle changes, including dietary modifications, portion control, and increased physical activity, primarily walking. However, the patient expressed concerns about maintaining her exercise routine with the onset of colder weather and the challenges of balancing work, graduate school, and personal life.  The patient reported walking two to three times a week and expressed an interest in joining a gym. She also considered indoor exercises, such as stair climbing and yoga, to maintain her physical activity during the colder months. The patient also reported significant dietary changes, including eliminating bread from her diet and replacing chips with peanuts or tree nuts. She expressed a willingness to explore other low-carb options, such as rye bread, pumpernickel, and multigrain bread, if she craved bread.  The patient also reported significant improvements in her stress levels. 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 Despite these changes, the patient reported no significant symptoms such as chest pain, palpitations, nausea, vomiting, excessive thirst, or frequent urination.         Patient Active Problem List   Diagnosis Date Noted   Dyslipidemia 02/24/2023   History of  uterine fibroid 04/28/2018   Genital herpes 04/28/2018   Pre-diabetes 03/03/2017    Past Surgical History:  Procedure Laterality Date   COLONOSCOPY WITH PROPOFOL N/A 12/18/2020   Procedure: COLONOSCOPY WITH PROPOFOL;  Surgeon: Wyline Mood, MD;  Location: [facility] ENDOSCOPY;  Service: Gastroenterology;  Laterality: N/A;   MYOMECTOMY  04/2003:06/2012   WISDOM TOOTH EXTRACTION  1990's    Family History  Problem Relation Age of Onset   Hypertension Mother    Diabetes type II Mother    Hypertension Father    Hypertension Maternal Grandmother    Diabetes Mellitus II Maternal Grandmother    Hypertension Paternal Grandmother    Stroke Paternal Grandmother    COPD Paternal Grandfather    Hypertension Maternal Aunt    COPD Paternal Aunt    Breast cancer Neg Hx     Social History   Tobacco Use   Smoking status: Never   Smokeless tobacco: Never  Substance Use Topics   Alcohol use: Yes    Alcohol/week: 1.0 standard drink of alcohol    Types: 1 Glasses of wine per week    Comment: none last 24 hrs     Current Outpatient Medications:    Cholecalciferol (VITAMIN D) 50 MCG (2000 UT) CAPS, Take 1 capsule by mouth daily., Disp: , Rfl:    Multiple Vitamins-Minerals (HAIR SKIN AND NAILS FORMULA PO), Take 1 tablet by mouth daily., Disp: , Rfl:    valACYclovir (VALTREX) 500 MG tablet, Take 1 tablet (500 mg total) by mouth daily., Disp: 90 tablet, Rfl: 3  Allergies  Allergen Reactions   Percocet [Oxycodone-Acetaminophen] Other (See Comments)    Sensitivity-made her  feel uncomfortable    I personally reviewed active problem list, medication list, allergies, family history, social history with the patient/caregiver today.   ROS  Constitutional: Negative for fever or weight change.  Respiratory: Negative for cough and shortness of breath.   Cardiovascular: Negative for chest pain or palpitations.  Gastrointestinal: Negative for abdominal pain, no bowel changes.  Musculoskeletal: Negative  for gait problem or joint swelling.  Skin: Negative for rash.  Neurological: Negative for dizziness or headache.  No other specific complaints in a complete review of systems (except as listed in HPI above).   Objective  Vitals:   02/24/23 0827  BP: 108/70  Pulse: 93  Resp: 16  Temp: 98.1 F (36.7 C)  TempSrc: Oral  SpO2: 98%  Weight: 161 lb 6.4 oz (73.2 kg)  Height: 5\' 5"  (1.651 m)    Body mass index is 26.86 kg/m.  Physical Exam  Constitutional: Patient appears well-developed and well-nourished.  No distress.  HEENT: head atraumatic, normocephalic, pupils equal and reactive to light, neck supple Cardiovascular: Normal rate, regular rhythm and normal heart sounds.  No murmur heard. No BLE edema. Pulmonary/Chest: Effort normal and breath sounds normal. No respiratory distress. Abdominal: Soft.  There is no tenderness. Psychiatric: Patient has a normal mood and affect. behavior is normal. Judgment and thought content normal.   Recent Results (from the past 2160 hour(s))  POCT glycosylated hemoglobin (Hb A1C)     Status: Abnormal   Collection Time: 02/24/23  8:32 AM  Result Value Ref Range   Hemoglobin A1C 6.1 (A) 4.0 - 5.6 %   HbA1c POC (<> result, manual entry)     HbA1c, POC (prediabetic range)     HbA1c, POC (controlled diabetic range)       PHQ2/9:    02/24/2023    8:15 AM 11/11/2022    7:54 AM 11/05/2021    7:50 AM 10/31/2020    9:13 AM 09/23/2019    1:46 PM  Depression screen PHQ 2/9  Decreased Interest 0 1 0 0 0  Down, Depressed, Hopeless 0 1 0 0 0  PHQ - 2 Score 0 2 0 0 0  Altered sleeping 0 0 0  0  Tired, decreased energy 0 0 0  0  Change in appetite 0 0 0  0  Feeling bad or failure about yourself  0 0 0  0  Trouble concentrating 0 0 0  0  Moving slowly or fidgety/restless 0 0 0  0  Suicidal thoughts 0 0 0  0  PHQ-9 Score 0 2 0  0  Difficult doing work/chores Not difficult at all        phq 9 is negative   Fall Risk:    02/24/2023    8:15 AM  11/11/2022    7:54 AM 11/05/2021    7:50 AM 10/31/2020    9:13 AM 09/23/2019    1:46 PM  Fall Risk   Falls in the past year? 0 0 0 0 0  Number falls in past yr: 0 0 0 0 0  Injury with Fall? 0 0 0 0 0  Risk for fall due to : No Fall Risks No Fall Risks No Fall Risks    Follow up Falls prevention discussed;Education provided;Falls evaluation completed Falls prevention discussed Falls prevention discussed      Functional Status Survey: Is the patient deaf or have difficulty hearing?: No Does the patient have difficulty seeing, even when wearing glasses/contacts?: No Does the patient have difficulty concentrating,  remembering, or making decisions?: No Does the patient have difficulty walking or climbing stairs?: No Does the patient have difficulty dressing or bathing?: No Does the patient have difficulty doing errands alone such as visiting a doctor's office or shopping?: No    Assessment & Plan  Assessment and Plan    Prediabetes A1c improved from 6.8% to 6.1% with lifestyle modifications including diet changes and increased physical activity. Discussed the importance of maintaining these changes, especially during the winter months, and provided suggestions for indoor exercises and dietary advice. -Continue current lifestyle modifications. -Plan to monitor A1c every 4 months to ensure continued control.  Dyslipidemia HDL slightly low at 48 and triglycerides slightly elevated at 161. LDL within normal limits. Discussed that these levels should improve with continued lifestyle modifications. -Continue current lifestyle modifications. -Expect lipid panel to be near normal at next check.  Stress Management Patient reports improved stress management with therapy, new job, and starting grad school. Discussed the importance of maintaining physical activity for stress relief. -Continue current stress management strategies.  Follow-up in 4 months to monitor A1c and lipid panel.

## 2023-02-24 ENCOUNTER — Encounter: Payer: Self-pay | Admitting: Family Medicine

## 2023-02-24 ENCOUNTER — Ambulatory Visit: Payer: Commercial Managed Care - PPO | Admitting: Family Medicine

## 2023-02-24 VITALS — BP 108/70 | HR 93 | Temp 98.1°F | Resp 16 | Ht 65.0 in | Wt 161.4 lb

## 2023-02-24 DIAGNOSIS — E785 Hyperlipidemia, unspecified: Secondary | ICD-10-CM | POA: Insufficient documentation

## 2023-02-24 DIAGNOSIS — R7303 Prediabetes: Secondary | ICD-10-CM | POA: Diagnosis not present

## 2023-02-24 DIAGNOSIS — F439 Reaction to severe stress, unspecified: Secondary | ICD-10-CM

## 2023-02-24 DIAGNOSIS — R739 Hyperglycemia, unspecified: Secondary | ICD-10-CM

## 2023-02-24 LAB — POCT GLYCOSYLATED HEMOGLOBIN (HGB A1C): Hemoglobin A1C: 6.1 % — AB (ref 4.0–5.6)

## 2023-04-08 DIAGNOSIS — H35423 Microcystoid degeneration of retina, bilateral: Secondary | ICD-10-CM | POA: Diagnosis not present

## 2023-04-08 DIAGNOSIS — H43813 Vitreous degeneration, bilateral: Secondary | ICD-10-CM | POA: Diagnosis not present

## 2023-04-08 DIAGNOSIS — H35413 Lattice degeneration of retina, bilateral: Secondary | ICD-10-CM | POA: Diagnosis not present

## 2023-04-08 DIAGNOSIS — H2511 Age-related nuclear cataract, right eye: Secondary | ICD-10-CM | POA: Diagnosis not present

## 2023-05-14 DIAGNOSIS — H35413 Lattice degeneration of retina, bilateral: Secondary | ICD-10-CM | POA: Diagnosis not present

## 2023-05-14 DIAGNOSIS — H2511 Age-related nuclear cataract, right eye: Secondary | ICD-10-CM | POA: Diagnosis not present

## 2023-05-14 DIAGNOSIS — H35423 Microcystoid degeneration of retina, bilateral: Secondary | ICD-10-CM | POA: Diagnosis not present

## 2023-05-14 DIAGNOSIS — H43813 Vitreous degeneration, bilateral: Secondary | ICD-10-CM | POA: Diagnosis not present

## 2023-06-25 ENCOUNTER — Ambulatory Visit (INDEPENDENT_AMBULATORY_CARE_PROVIDER_SITE_OTHER): Payer: Self-pay | Admitting: Family Medicine

## 2023-06-25 ENCOUNTER — Encounter: Payer: Self-pay | Admitting: Family Medicine

## 2023-06-25 VITALS — BP 114/70 | HR 96 | Resp 16 | Ht 65.0 in | Wt 156.5 lb

## 2023-06-25 DIAGNOSIS — R7303 Prediabetes: Secondary | ICD-10-CM

## 2023-06-25 DIAGNOSIS — E559 Vitamin D deficiency, unspecified: Secondary | ICD-10-CM | POA: Diagnosis not present

## 2023-06-25 DIAGNOSIS — E785 Hyperlipidemia, unspecified: Secondary | ICD-10-CM

## 2023-06-25 LAB — POCT GLYCOSYLATED HEMOGLOBIN (HGB A1C): Hemoglobin A1C: 6.3 % — AB (ref 4.0–5.6)

## 2023-06-25 MED ORDER — FREESTYLE LIBRE 3 PLUS SENSOR MISC
1.0000 | 5 refills | Status: DC
Start: 2023-06-25 — End: 2023-11-24
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
## 2023-07-13 ENCOUNTER — Encounter: Payer: Self-pay | Admitting: Family Medicine

## 2023-09-07 ENCOUNTER — Ambulatory Visit: Admission: EM | Admit: 2023-09-07 | Discharge: 2023-09-07 | Disposition: A | Discharge status: Home / Self Care

## 2023-09-07 ENCOUNTER — Encounter: Payer: Self-pay | Admitting: Emergency Medicine

## 2023-09-07 DIAGNOSIS — R21 Rash and other nonspecific skin eruption: Secondary | ICD-10-CM | POA: Diagnosis not present

## 2023-09-07 MED ORDER — TRIAMCINOLONE ACETONIDE 0.1 % EX OINT
1.0000 | TOPICAL_OINTMENT | Freq: Two times a day (BID) | CUTANEOUS | 0 refills | Status: DC
Start: 2023-09-07 — End: 2023-12-25

## 2023-09-07 NOTE — ED Triage Notes (Addendum)
 Pt c/o rash on left arm for 1 week. Rash is not painful but is itchy.   States she has had this happen before in April and she took benadryl then which made rash go away.

## 2023-09-07 NOTE — Discharge Instructions (Signed)
  1. Rash and nonspecific skin eruption (Primary) - triamcinolone ointment (KENALOG) 0.1 %; Apply 1 Application topically 2 (two) times daily.  Dispense: 30 g; Refill: 0 -Take Benadryl 25 to 50 mg as needed for pruritic skin rash. - Follow-up with PCP for further evaluation and management should symptoms not subside. -Continue to monitor symptoms for any change in severity if there is any escalation of current symptoms or development of new symptoms follow-up in ER for further evaluation and management.

## 2023-09-07 NOTE — ED Provider Notes (Signed)
 [facility]  Note:  This document was prepared using Dragon voice recognition software and may include unintentional dictation errors.  MRN: [id number] DOB: 1970-06-08  Subjective:   Alexis Golden is a 53 y.o. female presenting for pruritic skin rash to the left arm x 1 week.  Patient reports that rash is not painful but very itchy.  She reports that she has had a rash very similar to this in the past in the exact same spot.  Patient states that she took Benadryl the first time and rash went away.  Patient states she has not taken Benadryl at this time as she wanted to have a picture taken and put in her chart for when she follows up with her doctor.  Patient denies any fever, body aches, fatigue, weakness, dizziness, shortness of breath, chest pain.  No current facility-administered medications for this encounter.  Current Outpatient Medications:    triamcinolone ointment (KENALOG) 0.1 %, Apply 1 Application topically 2 (two) times daily., Disp: 30 g, Rfl: 0   Cholecalciferol (VITAMIN D ) 50 MCG (2000 UT) CAPS, Take 1 capsule by mouth daily., Disp: , Rfl:    Continuous Glucose Sensor (FREESTYLE LIBRE 3 PLUS SENSOR) MISC, 1 each by Other route as directed. Change sensor every 15 days., Disp: 2 each, Rfl: 5   Multiple Vitamins-Minerals (HAIR SKIN AND NAILS FORMULA PO), Take 1 tablet by mouth daily., Disp: , Rfl:    valACYclovir  (VALTREX ) 500 MG tablet, Take 1 tablet (500 mg total) by mouth daily., Disp: 90 tablet, Rfl: 3   Allergies  Allergen Reactions   Percocet [Oxycodone-Acetaminophen] Other (See Comments)    Sensitivity-made her feel uncomfortable    Past Medical History:  Diagnosis Date   Fibroids    Genital herpes      Past Surgical History:  Procedure Laterality Date   COLONOSCOPY WITH PROPOFOL  N/A 12/18/2020   Procedure: COLONOSCOPY WITH PROPOFOL ;  Surgeon: Luke Salaam, MD;  Location: [facility] ENDOSCOPY;  Service: Gastroenterology;  Laterality: N/A;    MYOMECTOMY  04/2003:06/2012   WISDOM TOOTH EXTRACTION  1990's    Family History  Problem Relation Age of Onset   Hypertension Mother    Diabetes type II Mother    Hypertension Father    Hypertension Maternal Grandmother    Diabetes Mellitus II Maternal Grandmother    Hypertension Paternal Grandmother    Stroke Paternal Grandmother    COPD Paternal Grandfather    Hypertension Maternal Aunt    COPD Paternal Aunt    Breast cancer Neg Hx     Social History   Tobacco Use   Smoking status: Never   Smokeless tobacco: Never  Vaping Use   Vaping status: Never Used  Substance Use Topics   Alcohol use: Yes    Alcohol/week: 1.0 standard drink of alcohol    Types: 1 Glasses of wine per week    Comment: none last 24 hrs   Drug use: No    ROS Refer to HPI for ROS details.  Objective:    Vitals: BP 115/67 (BP Location: Right Arm)   Pulse 95   Temp 98.2 F (36.8 C) (Oral)   Resp 16   SpO2 98%   Physical Exam Vitals and nursing note reviewed.  Constitutional:      General: She is not in acute distress.    Appearance: Normal appearance. She is not ill-appearing.  HENT:     Head: Normocephalic.   Cardiovascular:     Rate and Rhythm: Normal rate.  Pulmonary:  Effort: Pulmonary effort is normal. No respiratory distress.   Skin:    General: Skin is warm and dry.     Capillary Refill: Capillary refill takes less than 2 seconds.     Findings: Erythema and rash present. Rash is papular.   Neurological:     General: No focal deficit present.     Mental Status: She is alert and oriented to person, place, and time.   Psychiatric:        Mood and Affect: Mood normal.        Behavior: Behavior normal.     Procedures  No results found for this or any previous visit (from the past 24 hours).  Assessment and Plan :     Discharge Instructions       1. Rash and nonspecific skin eruption (Primary) - triamcinolone ointment (KENALOG) 0.1 %; Apply 1 Application  topically 2 (two) times daily.  Dispense: 30 g; Refill: 0 -Take Benadryl 25 to 50 mg as needed for pruritic skin rash. - Follow-up with PCP for further evaluation and management should symptoms not subside. -Continue to monitor symptoms for any change in severity if there is any escalation of current symptoms or development of new symptoms follow-up in ER for further evaluation and management.      Gryffin Altice B Rishith Siddoway   Lowella Kindley, Roberdel B, [place] 09/07/23 1720

## 2023-09-16 ENCOUNTER — Telehealth: Payer: Self-pay | Admitting: Pharmacy Technician

## 2023-09-16 ENCOUNTER — Other Ambulatory Visit (HOSPITAL_COMMUNITY): Payer: Self-pay

## 2023-09-16 NOTE — Telephone Encounter (Signed)
 Pharmacy Patient Advocate Encounter  Received notification from [facility] that Prior Authorization for FREESTYLE LIBRE 3 PLUS SENSOR has been DENIED.  Full denial letter will be uploaded to the media tab. See denial reason below.     PA #/Case ID/Reference #: [id number]

## 2023-09-16 NOTE — Telephone Encounter (Signed)
 Pharmacy Patient Advocate Encounter   Received notification from CoverMyMeds that prior authorization for FreeStyle Libre 3 Plus Sensor is required/requested.   Insurance verification completed.   The patient is insured through [facility] .   Per test claim: PA required; PA submitted to above mentioned insurance via CoverMyMeds Key/confirmation #/EOC [facility] Status is pending

## 2023-09-17 ENCOUNTER — Encounter: Payer: Self-pay | Admitting: Pharmacy Technician

## 2023-09-17 NOTE — Telephone Encounter (Signed)
 error

## 2023-09-21 ENCOUNTER — Other Ambulatory Visit (HOSPITAL_COMMUNITY): Payer: Self-pay

## 2023-09-21 DIAGNOSIS — Z01419 Encounter for gynecological examination (general) (routine) without abnormal findings: Secondary | ICD-10-CM | POA: Diagnosis not present

## 2023-09-21 DIAGNOSIS — D219 Benign neoplasm of connective and other soft tissue, unspecified: Secondary | ICD-10-CM | POA: Diagnosis not present

## 2023-09-21 DIAGNOSIS — Z124 Encounter for screening for malignant neoplasm of cervix: Secondary | ICD-10-CM | POA: Diagnosis not present

## 2023-09-21 DIAGNOSIS — N951 Menopausal and female climacteric states: Secondary | ICD-10-CM | POA: Diagnosis not present

## 2023-11-24 ENCOUNTER — Ambulatory Visit (INDEPENDENT_AMBULATORY_CARE_PROVIDER_SITE_OTHER): Admitting: Family Medicine

## 2023-11-24 ENCOUNTER — Encounter: Payer: Self-pay | Admitting: Family Medicine

## 2023-11-24 VITALS — BP 112/74 | HR 87 | Resp 16 | Ht 65.0 in | Wt 157.0 lb

## 2023-11-24 DIAGNOSIS — Z1159 Encounter for screening for other viral diseases: Secondary | ICD-10-CM

## 2023-11-24 DIAGNOSIS — Z23 Encounter for immunization: Secondary | ICD-10-CM

## 2023-11-24 DIAGNOSIS — A6004 Herpesviral vulvovaginitis: Secondary | ICD-10-CM

## 2023-11-24 DIAGNOSIS — E559 Vitamin D deficiency, unspecified: Secondary | ICD-10-CM | POA: Diagnosis not present

## 2023-11-24 DIAGNOSIS — Z130 Encounter for screening for diseases of the blood and blood-forming organs and certain disorders involving the immune mechanism: Secondary | ICD-10-CM

## 2023-11-24 DIAGNOSIS — R7303 Prediabetes: Secondary | ICD-10-CM | POA: Diagnosis not present

## 2023-11-24 DIAGNOSIS — E785 Hyperlipidemia, unspecified: Secondary | ICD-10-CM | POA: Diagnosis not present

## 2023-11-24 LAB — POCT GLYCOSYLATED HEMOGLOBIN (HGB A1C): Hemoglobin A1C: 6.2 % — AB (ref 4.0–5.6)

## 2023-11-24 MED ORDER — FREESTYLE LIBRE 3 PLUS SENSOR MISC: 1.0000 | 5 refills | Status: AC

## 2023-11-24 MED ORDER — VALACYCLOVIR HCL 500 MG PO TABS
500.0000 mg | ORAL_TABLET | Freq: Every day | ORAL | 3 refills | Status: DC
Start: 2023-11-24 — End: 2023-11-24

## 2023-11-24 MED ORDER — VALACYCLOVIR HCL 500 MG PO TABS: 500.0000 mg | ORAL_TABLET | Freq: Every day | ORAL | 0 refills | Status: AC

## 2023-11-24 NOTE — Progress Notes (Signed)
 Name: Alexis Golden   MRN: [id number]    DOB: 10-Jun-1970   Date:11/24/2023       Progress Note  Subjective  Chief Complaint  Chief Complaint  Patient presents with   Medical Management of Chronic Issues   Discussed the use of AI scribe software for clinical note transcription with the patient, who gave verbal consent to proceed.  History of Present Illness Alexis Golden is a 53 year old female with prediabetes who presents for a four-month follow-up.  Over the past four months, she has been actively managing her prediabetes through dietary changes and increased physical activity. Her hemoglobin A1c has decreased from 6.3% to 6.2%. She avoids high-sugar foods and drinks, opting for sugar-free alternatives, and is mindful of her carbohydrate intake, choosing whole grains and sourdough bread. She uses a [employer] continuous glucose monitor to track her blood sugar levels.  She has incorporated regular physical activity into her routine, walking three miles three times a week, usually in the morning.  Her lipid profile has shown improvement with her triglycerides decreasing from 236 mg/dL to 838 mg/dL, and her HDL increasing to 48 mg/dL. Her LDL remains at a healthy level of 74 mg/dL.  She has a history of iron deficiency anemia related to fibroids, which was a concern in the past, but she does not currently have symptoms of anemia. She is taking vitamin D  supplements as her levels were previously low.  She experiences herpes vulvovaginitis and uses Valtrex  as needed, typically twice a year, during periods of stress. She has a prescription for 90 tablets to manage outbreaks.  Recently, she has noticed a rash on her face that she associates with yogurt consumption. She has stopped eating yogurt, which has led to an improvement in the rash. She uses triamcinolone  ointment to manage the rash given by a provider at urgent care    Patient Active Problem List   Diagnosis Date Noted   Dyslipidemia  02/24/2023   History of uterine fibroid 04/28/2018   Genital herpes 04/28/2018   Pre-diabetes 03/03/2017    Past Surgical History:  Procedure Laterality Date   COLONOSCOPY WITH PROPOFOL  N/A 12/18/2020   Procedure: COLONOSCOPY WITH PROPOFOL ;  Surgeon: Therisa Bi, MD;  Location: [facility];  Service: Gastroenterology;  Laterality: N/A;   MYOMECTOMY  04/2003:06/2012   WISDOM TOOTH EXTRACTION  1990's    Family History  Problem Relation Age of Onset   Hypertension Mother    Diabetes type II Mother    Diabetes Mother    Hypertension Father    Hypertension Maternal Grandmother    Diabetes Mellitus II Maternal Grandmother    Diabetes Maternal Grandmother    Hypertension Paternal Grandmother    Stroke Paternal Grandmother    COPD Paternal Grandfather    Cancer Brother    Hypertension Maternal Aunt    COPD Paternal Aunt    Breast cancer Neg Hx     Social History   Tobacco Use   Smoking status: Never   Smokeless tobacco: Never  Substance Use Topics   Alcohol use: Yes    Alcohol/week: 1.0 standard drink of alcohol    Types: 1 Glasses of wine per week    Comment: none last 24 hrs     Current Outpatient Medications:    Cholecalciferol (VITAMIN D ) 50 MCG (2000 UT) CAPS, Take 1 capsule by mouth daily., Disp: , Rfl:    Multiple Vitamins-Minerals (HAIR SKIN AND NAILS FORMULA PO), Take 1 tablet by mouth daily., Disp: , Rfl:  triamcinolone  ointment (KENALOG ) 0.1 %, Apply 1 Application topically 2 (two) times daily., Disp: 30 g, Rfl: 0   Continuous Glucose Sensor (FREESTYLE LIBRE 3 PLUS SENSOR) MISC, 1 each by Other route as directed. Change sensor every 15 days., Disp: 2 each, Rfl: 5   valACYclovir  (VALTREX ) 500 MG tablet, Take 1 tablet (500 mg total) by mouth daily., Disp: 90 tablet, Rfl: 3  Allergies  Allergen Reactions   Percocet [Oxycodone-Acetaminophen] Other (See Comments)    Sensitivity-made her feel uncomfortable    I personally reviewed active problem list,  medication list, allergies, family history with the patient/caregiver today.   ROS  Ten systems reviewed and is negative except as mentioned in HPI    Objective Physical Exam CONSTITUTIONAL: Patient appears well-developed and well-nourished. No distress. HEENT: Head atraumatic, normocephalic, neck supple. Cerumen present in right ear. Left ear without cerumen. Rash present on face with dry, bumpy skin. CARDIOVASCULAR: Normal rate, regular rhythm and normal heart sounds. No murmur heard. No BLE edema. PULMONARY: Effort normal and breath sounds normal. Lungs clear to auscultation bilaterally. No respiratory distress. SKIN: eczematous patch on left lower arm MUSCULOSKELETAL: Normal gait. Without gross motor or sensory deficit. PSYCHIATRIC: Patient has a normal mood and affect. Behavior is normal. Judgment and thought content normal.  Vitals:   11/24/23 0803  BP: 112/74  Pulse: 87  Resp: 16  SpO2: 99%  Weight: 157 lb (71.2 kg)  Height: 5' 5 (1.651 m)    Body mass index is 26.13 kg/m.  Recent Results (from the past 2160 hours)  POCT glycosylated hemoglobin (Hb A1C)     Status: Abnormal   Collection Time: 11/24/23  8:05 AM  Result Value Ref Range   Hemoglobin A1C 6.2 (A) 4.0 - 5.6 %   HbA1c POC (<> result, manual entry)     HbA1c, POC (prediabetic range)     HbA1c, POC (controlled diabetic range)       PHQ2/9:    11/24/2023    7:53 AM 06/25/2023    8:32 AM 02/24/2023    8:15 AM 11/11/2022    7:54 AM 11/05/2021    7:50 AM  Depression screen PHQ 2/9  Decreased Interest 0 0 0 1 0  Down, Depressed, Hopeless 0 0 0 1 0  PHQ - 2 Score 0 0 0 2 0  Altered sleeping  0 0 0 0  Tired, decreased energy  0 0 0 0  Change in appetite  0 0 0 0  Feeling bad or failure about yourself   0 0 0 0  Trouble concentrating  0 0 0 0  Moving slowly or fidgety/restless  0 0 0 0  Suicidal thoughts  0 0 0 0  PHQ-9 Score  0 0 2 0  Difficult doing work/chores  Not difficult at all Not difficult at all       phq 9 is negative  Fall Risk:    11/24/2023    7:53 AM 02/24/2023    8:15 AM 11/11/2022    7:54 AM 11/05/2021    7:50 AM 10/31/2020    9:13 AM  Fall Risk   Falls in the past year? 0 0 0 0 0  Number falls in past yr: 0 0 0 0 0  Injury with Fall? 0 0 0 0 0  Risk for fall due to : No Fall Risks No Fall Risks No Fall Risks No Fall Risks   Follow up Falls evaluation completed Falls prevention discussed;Education provided;Falls evaluation completed Falls prevention discussed  Falls prevention discussed       Data saved with a previous flowsheet row definition      Assessment & Plan Prediabetes Improved glycemic control with A1c decreased from 6.3 to 6.2 due to dietary management and increased physical activity. Continuous glucose monitoring with Freestyle Herlene has been beneficial. - Continue current dietary modifications and physical activity regimen. - Continue using Freestyle Libre for continuous glucose monitoring. - Avoid fake sugars and opt for natural alternatives like club soda with lemon or lime. - Monitor A1c levels regularly.  Dyslipidemia  Improved triglycerides from 236 to 161, HDL improved to 48, and LDL at 74 due to dietary changes and increased physical activity. - Order lipid panel to monitor cholesterol levels.  Vitamin D  deficiency - Order vitamin D  level to assess current status.  Herpesviral vulvovaginitis Managed with Valtrex  as needed, used approximately twice this year during stress. - Prescribe Valtrex  90 tablets for as-needed use.  Eczema and keratosis pilaris Recent flare-up associated with yogurt consumption, improved upon discontinuation. Current treatment includes triamcinolone  ointment. - Continue using triamcinolone  ointment as needed. - Avoid yogurt to prevent flare-ups.  General Health Maintenance Up to date with GYN visits and screenings. Discussed potential need for pneumonia and COVID vaccinations. - Administer PCV20 pneumonia vaccine. -  Provide prescription for COVID-19 vaccine if needed. - Order CBC to monitor for iron deficiency anemia. - Order hepatitis B screening.  Follow-Up Follow-up scheduled to monitor ongoing health conditions and management plans. - Follow up in 6 months for routine check-up. - Consider EKG at next visit if not previously done.

## 2023-11-25 LAB — CBC WITH DIFFERENTIAL/PLATELET
Absolute Lymphocytes: 2633 {cells}/uL (ref 850–3900)
Absolute Monocytes: 308 {cells}/uL (ref 200–950)
Basophils Absolute: 17 {cells}/uL (ref 0–200)
Basophils Relative: 0.3 %
Eosinophils Absolute: 80 {cells}/uL (ref 15–500)
Eosinophils Relative: 1.4 %
HCT: 41.7 % (ref 35.0–45.0)
Hemoglobin: 13.6 g/dL (ref 11.7–15.5)
MCH: 28.3 pg (ref 27.0–33.0)
MCHC: 32.6 g/dL (ref 32.0–36.0)
MCV: 86.9 fL (ref 80.0–100.0)
MPV: 11.9 fL (ref 7.5–12.5)
Monocytes Relative: 5.4 %
Neutro Abs: 2662 {cells}/uL (ref 1500–7800)
Neutrophils Relative %: 46.7 %
Platelets: 233 Thousand/uL (ref 140–400)
RBC: 4.8 Million/uL (ref 3.80–5.10)
RDW: 12.3 % (ref 11.0–15.0)
Total Lymphocyte: 46.2 %
WBC: 5.7 Thousand/uL (ref 3.8–10.8)

## 2023-11-25 LAB — LIPID PANEL
Cholesterol: 161 mg/dL (ref <200)
HDL: 50 mg/dL (ref >=50)
LDL Cholesterol (Calc): 80 mg/dL
Non-HDL Cholesterol (Calc): 111 mg/dL (ref <130)
Total CHOL/HDL Ratio: 3.2 (calc) (ref <5.0)
Triglycerides: 216 mg/dL — ABNORMAL HIGH (ref <150)

## 2023-11-25 LAB — COMPREHENSIVE METABOLIC PANEL WITH GFR
AG Ratio: 1.6 (calc) (ref 1.0–2.5)
ALT: 16 U/L (ref 6–29)
AST: 16 U/L (ref 10–35)
Albumin: 4.7 g/dL (ref 3.6–5.1)
Alkaline phosphatase (APISO): 43 U/L (ref 37–153)
BUN: 13 mg/dL (ref 7–25)
CO2: 28 mmol/L (ref 20–32)
Calcium: 10 mg/dL (ref 8.6–10.4)
Chloride: 104 mmol/L (ref 98–110)
Creat: 0.79 mg/dL (ref 0.50–1.03)
Globulin: 2.9 g/dL (ref 1.9–3.7)
Glucose, Bld: 117 mg/dL — ABNORMAL HIGH (ref 65–99)
Potassium: 4.3 mmol/L (ref 3.5–5.3)
Sodium: 142 mmol/L (ref 135–146)
Total Bilirubin: 0.3 mg/dL (ref 0.2–1.2)
Total Protein: 7.6 g/dL (ref 6.1–8.1)
eGFR: 89 mL/min/1.73m2 (ref >=60)

## 2023-11-25 LAB — HEPATITIS B SURFACE ANTIBODY,QUALITATIVE: Hep B S Ab: REACTIVE — AB

## 2023-11-25 LAB — VITAMIN D 25 HYDROXY (VIT D DEFICIENCY, FRACTURES): Vit D, 25-Hydroxy: 43 ng/mL (ref 30–100)

## 2023-11-26 ENCOUNTER — Ambulatory Visit: Payer: Self-pay | Admitting: Family Medicine

## 2023-12-10 ENCOUNTER — Ambulatory Visit: Admitting: Family Medicine

## 2023-12-25 ENCOUNTER — Ambulatory Visit
Admission: EM | Admit: 2023-12-25 | Discharge: 2023-12-25 | Disposition: A | Discharge status: Home / Self Care | Attending: Family Medicine | Admitting: Family Medicine

## 2023-12-25 ENCOUNTER — Other Ambulatory Visit: Payer: Self-pay

## 2023-12-25 DIAGNOSIS — H6123 Impacted cerumen, bilateral: Secondary | ICD-10-CM | POA: Diagnosis not present

## 2023-12-25 NOTE — Discharge Instructions (Addendum)
 You were seen today for cerumen impaction, which were cleaned out today.  Please return as needed.

## 2023-12-25 NOTE — ED Provider Notes (Signed)
 [facility]    CSN: [id number] Arrival date & time: 12/25/23  9166      History   Chief Complaint Chief Complaint  Patient presents with   Ear Fullness    HPI Alexis Golden is a 53 y.o. female.    Ear Fullness  Patient is here for right ear fullness since last night.  No pain. No uri symptoms.  Thinks there may be wax.  No other issues.     Past Medical History:  Diagnosis Date   Blood transfusion without reported diagnosis 2005   After surgery   Fibroids    Genital herpes     Patient Active Problem List   Diagnosis Date Noted   Vitamin D  deficiency 11/24/2023   Dyslipidemia 02/24/2023   History of uterine fibroid 04/28/2018   Genital herpes 04/28/2018   Pre-diabetes 03/03/2017    Past Surgical History:  Procedure Laterality Date   COLONOSCOPY WITH PROPOFOL  N/A 12/18/2020   Procedure: COLONOSCOPY WITH PROPOFOL ;  Surgeon: Therisa Bi, MD;  Location: [facility] ENDOSCOPY;  Service: Gastroenterology;  Laterality: N/A;   MYOMECTOMY  04/2003:06/2012   WISDOM TOOTH EXTRACTION  1990's    OB History     Gravida  1   Para  0   Term      Preterm      AB  1   Living  0      SAB      IAB      Ectopic      Multiple      Live Births               Home Medications    Prior to Admission medications   Medication Sig Start Date End Date Taking? Authorizing Provider  Cholecalciferol (VITAMIN D ) 50 MCG (2000 UT) CAPS Take 1 capsule by mouth daily.    [provider]  Continuous Glucose Sensor (FREESTYLE LIBRE 3 PLUS SENSOR) MISC 1 each by Other route as directed. Change sensor every 15 days. 11/24/23   Sowles, Krichna, MD  Multiple Vitamins-Minerals (HAIR SKIN AND NAILS FORMULA PO) Take 1 tablet by mouth daily.    [provider]  triamcinolone  ointment (KENALOG ) 0.1 % Apply 1 Application topically 2 (two) times daily. 09/07/23   Reddick, Johnathan B, NP  valACYclovir  (VALTREX ) 500 MG tablet Take 1 tablet (500 mg total) by mouth  daily. 11/24/23   Sowles, Krichna, MD    Family History Family History  Problem Relation Age of Onset   Hypertension Mother    Diabetes type II Mother    Diabetes Mother    Hypertension Father    Hypertension Maternal Grandmother    Diabetes Mellitus II Maternal Grandmother    Diabetes Maternal Grandmother    Hypertension Paternal Grandmother    Stroke Paternal Grandmother    COPD Paternal Grandfather    Cancer Brother    Hypertension Maternal Aunt    COPD Paternal Aunt    Breast cancer Neg Hx     Social History Social History   Tobacco Use   Smoking status: Never   Smokeless tobacco: Never  Vaping Use   Vaping status: Never Used  Substance Use Topics   Alcohol use: Yes    Alcohol/week: 1.0 standard drink of alcohol    Types: 1 Glasses of wine per week    Comment: none last 24 hrs   Drug use: No     Allergies   Percocet [oxycodone-acetaminophen]   Review of Systems Review of Systems  Constitutional: Negative.   HENT:  Positive for hearing loss.   Respiratory: Negative.    Cardiovascular: Negative.   Gastrointestinal: Negative.   Musculoskeletal: Negative.   Psychiatric/Behavioral: Negative.       Physical Exam Triage Vital Signs ED Triage Vitals  Encounter Vitals Group     BP 12/25/23 0846 127/82     Girls Systolic BP Percentile --      Girls Diastolic BP Percentile --      Boys Systolic BP Percentile --      Boys Diastolic BP Percentile --      Pulse Rate 12/25/23 0846 77     Resp 12/25/23 0846 16     Temp 12/25/23 0846 98.2 F (36.8 C)     Temp Source 12/25/23 0846 Oral     SpO2 12/25/23 0846 98 %     Weight 12/25/23 0846 153 lb (69.4 kg)     Height 12/25/23 0846 5' 5 (1.651 m)     Head Circumference --      Peak Flow --      Pain Score 12/25/23 0855 0     Pain Loc --      Pain Education --      Exclude from Growth Chart --    No data found.  Updated Vital Signs BP 127/82 (BP Location: Right Arm)   Pulse 77   Temp 98.2 F (36.8 C)  (Oral)   Resp 16   Ht 5' 5 (1.651 m)   Wt 69.4 kg   LMP  (Within Years)   SpO2 98%   BMI 25.46 kg/m   Visual Acuity Right Eye Distance:   Left Eye Distance:   Bilateral Distance:    Right Eye Near:   Left Eye Near:    Bilateral Near:     Physical Exam Constitutional:      General: She is not in acute distress.    Appearance: Normal appearance. She is normal weight. She is not ill-appearing or toxic-appearing.  HENT:     Right Ear: There is impacted cerumen.     Left Ear: There is impacted cerumen.  Cardiovascular:     Rate and Rhythm: Normal rate and regular rhythm.  Pulmonary:     Effort: Pulmonary effort is normal.     Breath sounds: Normal breath sounds.  Neurological:     General: No focal deficit present.     Mental Status: She is alert.  Psychiatric:        Mood and Affect: Mood normal.      UC Treatments / Results  Labs (all labs ordered are listed, but only abnormal results are displayed) Labs Reviewed - No data to display  EKG   Radiology No results found.  Procedures Bilateral ears irritated;  Wax removed fully;  Tms normal after procedure.   Medications Ordered in UC Medications - No data to display  Initial Impression / Assessment and Plan / UC Course  I have reviewed the triage vital signs and the nursing notes.  Pertinent labs & imaging results that were available during my care of the patient were reviewed by me and considered in my medical decision making (see chart for details).  Final Clinical Impressions(s) / UC Diagnoses   Final diagnoses:  Bilateral impacted cerumen     Discharge Instructions      You were seen today for cerumen impaction, which were cleaned out today.  Please return as needed.     ED Prescriptions   None  PDMP not reviewed this encounter.   Darral Longs, MD 12/25/23 [phone]

## 2023-12-25 NOTE — ED Triage Notes (Signed)
 Pt presents with a chief complaint of right ear fullness. States she showered last night and believes she may have gotten water in her ear. Attempted to remove however feels like she may have impacted the ear. Muffled hearing in right ear at this time. No pain.

## 2024-01-27 ENCOUNTER — Ambulatory Visit (INDEPENDENT_AMBULATORY_CARE_PROVIDER_SITE_OTHER): Admitting: Family Medicine

## 2024-01-27 ENCOUNTER — Encounter: Payer: Self-pay | Admitting: Family Medicine

## 2024-01-27 VITALS — BP 110/72 | HR 98 | Resp 16 | Ht 64.5 in | Wt 157.5 lb

## 2024-01-27 DIAGNOSIS — Z13820 Encounter for screening for osteoporosis: Secondary | ICD-10-CM

## 2024-01-27 DIAGNOSIS — E2839 Other primary ovarian failure: Secondary | ICD-10-CM

## 2024-01-27 DIAGNOSIS — R87612 Low grade squamous intraepithelial lesion on cytologic smear of cervix (LGSIL): Secondary | ICD-10-CM

## 2024-01-27 DIAGNOSIS — Z1231 Encounter for screening mammogram for malignant neoplasm of breast: Secondary | ICD-10-CM | POA: Diagnosis not present

## 2024-01-27 DIAGNOSIS — Z0000 Encounter for general adult medical examination without abnormal findings: Secondary | ICD-10-CM | POA: Diagnosis not present

## 2024-01-27 NOTE — Progress Notes (Signed)
 Name: Alexis Golden   MRN: [id number]    DOB: 1970/10/28   Date:01/27/2024       Progress Note  Subjective  Chief Complaint  Chief Complaint  Patient presents with   Annual Exam    HPI  Patient presents for annual CPE.  Diet: balanced, using Freestyle CGM and losing weight  Exercise: continue regular physical activity   Last Eye Exam: completed Last Dental Exam: completed  Flowsheet Row Office Visit from 01/27/2024 in [facility]  AUDIT-C Score 1    Depression: Phq 9 is  negative    01/27/2024    1:00 PM 11/24/2023    7:53 AM 06/25/2023    8:32 AM 02/24/2023    8:15 AM 11/11/2022    7:54 AM  Depression screen PHQ 2/9  Decreased Interest 0 0 0 0 1  Down, Depressed, Hopeless 0 0 0 0 1  PHQ - 2 Score 0 0 0 0 2  Altered sleeping   0 0 0  Tired, decreased energy   0 0 0  Change in appetite   0 0 0  Feeling bad or failure about yourself    0 0 0  Trouble concentrating   0 0 0  Moving slowly or fidgety/restless   0 0 0  Suicidal thoughts   0 0 0  PHQ-9 Score   0 0 2  Difficult doing work/chores   Not difficult at all Not difficult at all    Hypertension: BP Readings from Last 3 Encounters:  01/27/24 110/72  12/25/23 127/82  11/24/23 112/74   Obesity: Wt Readings from Last 3 Encounters:  01/27/24 157 lb 8 oz (71.4 kg)  12/25/23 153 lb (69.4 kg)  11/24/23 157 lb (71.2 kg)   BMI Readings from Last 3 Encounters:  01/27/24 26.62 kg/m  12/25/23 25.46 kg/m  11/24/23 26.13 kg/m     Vaccines: reviewed with the patient.   Hep C Screening: completed STD testing and prevention (HIV/chl/gon/syphilis): N/A Intimate partner violence: negative screen  Sexual History : not sexually active since July 2024 Menstrual History/LMP/Abnormal Bleeding: post menopausal  Discussed importance of follow up if any post-menopausal bleeding: yes  Incontinence Symptoms: negative for symptoms   Breast cancer:  - Last Mammogram: placing an order today  - BRCA gene  screening: N/A  Osteoporosis Prevention : Discussed high calcium and vitamin D  supplementation, weight bearing exercises Bone density :yes   Cervical cancer screening: encouraged to schedule with GYN  Skin cancer: Discussed monitoring for atypical lesions  Colorectal cancer: up to date   Lung cancer:  Low Dose CT Chest recommended if Age 24-80 years, 20 pack-year currently smoking OR have quit w/in 15years. Patient does not qualify for screen   ECG: 2024  Advanced Care Planning: A voluntary discussion about advance care planning including the explanation and discussion of advance directives.  Discussed health care proxy and Living will, and the patient was able to identify a health care proxy as brother   Patient does have a living will and power of attorney of health care   Patient Active Problem List   Diagnosis Date Noted   Vitamin D  deficiency 11/24/2023   Dyslipidemia 02/24/2023   History of uterine fibroid 04/28/2018   Genital herpes 04/28/2018   Pre-diabetes 03/03/2017    Past Surgical History:  Procedure Laterality Date   COLONOSCOPY WITH PROPOFOL  N/A 12/18/2020   Procedure: COLONOSCOPY WITH PROPOFOL ;  Surgeon: Therisa Bi, MD;  Location: [facility] ENDOSCOPY;  Service: Gastroenterology;  Laterality:  N/A;   MYOMECTOMY  04/2003:06/2012   WISDOM TOOTH EXTRACTION  1990's    Family History  Problem Relation Age of Onset   Hypertension Mother    Diabetes type II Mother    Diabetes Mother    Hypertension Father    Hypertension Maternal Grandmother    Diabetes Mellitus II Maternal Grandmother    Diabetes Maternal Grandmother    Hypertension Paternal Grandmother    Stroke Paternal Grandmother    COPD Paternal Grandfather    Cancer Brother    Hypertension Maternal Aunt    Diabetes Maternal Aunt    Kidney disease Maternal Aunt    COPD Paternal Aunt    Cancer Paternal Aunt    COPD Paternal Aunt    Breast cancer Neg Hx     Social History   Socioeconomic History   Marital  status: Divorced    Spouse name: Not on file   Number of children: 0   Years of education: Not on file   Highest education level: Bachelor's degree (e.g., BA, AB, BS)  Occupational History   Not on file  Tobacco Use   Smoking status: Never   Smokeless tobacco: Never  Vaping Use   Vaping status: Never Used  Substance and Sexual Activity   Alcohol use: Yes    Alcohol/week: 1.0 standard drink of alcohol    Types: 1 Glasses of wine per week    Comment: none last 24 hrs   Drug use: No   Sexual activity: Not Currently    Partners: Male    Birth control/protection: Abstinence, None  Other Topics Concern   Not on file  Social History Narrative   She was married for 6 years,  divorced since 03/2017    Tried to have children but unable.    Going to graduate school for a masters  health care administration    Social Drivers of Health   Financial Resource Strain: Low Risk  (01/26/2024)   Overall Financial Resource Strain (CARDIA)    Difficulty of Paying Living Expenses: Not hard at all  Food Insecurity: No Food Insecurity (01/26/2024)   Hunger Vital Sign    Worried About Running Out of Food in the Last Year: Never true    Ran Out of Food in the Last Year: Never true  Transportation Needs: No Transportation Needs (01/26/2024)   PRAPARE - Administrator, Civil Service (Medical): No    Lack of Transportation (Non-Medical): No  Physical Activity: Sufficiently Active (01/26/2024)   Exercise Vital Sign    Days of Exercise per Week: 3 days    Minutes of Exercise per Session: 60 min  Stress: No Stress Concern Present (01/26/2024)   [employer] of Occupational Health - Occupational Stress Questionnaire    Feeling of Stress: Only a little  Social Connections: Moderately Isolated (01/26/2024)   Social Connection and Isolation Panel    Frequency of Communication with Friends and Family: More than three times a week    Frequency of Social Gatherings with Friends and Family: Once  a week    Attends Religious Services: More than 4 times per year    Active Member of [employer] or Organizations: No    Attends Banker Meetings: Not on file    Marital Status: Divorced  Intimate Partner Violence: Not At Risk (01/27/2024)   Humiliation, Afraid, Rape, and Kick questionnaire    Fear of Current or Ex-Partner: No    Emotionally Abused: No    Physically Abused: No  Sexually Abused: No     Current Outpatient Medications:    Ashwagandha (ASHWAGANDHA GUMMIES) 500 MG CHEW, , Disp: , Rfl:    Cholecalciferol (VITAMIN D ) 50 MCG (2000 UT) CAPS, Take 1 capsule by mouth daily., Disp: , Rfl:    Continuous Glucose Sensor (FREESTYLE LIBRE 3 PLUS SENSOR) MISC, 1 each by Other route as directed. Change sensor every 15 days., Disp: 2 each, Rfl: 5   Multiple Vitamins-Minerals (HAIR SKIN AND NAILS FORMULA PO), Take 1 tablet by mouth daily., Disp: , Rfl:    valACYclovir  (VALTREX ) 500 MG tablet, Take 1 tablet (500 mg total) by mouth daily., Disp: 90 tablet, Rfl: 0  Allergies  Allergen Reactions   Percocet [Oxycodone-Acetaminophen] Other (See Comments)    Sensitivity-made her feel uncomfortable     ROS  Constitutional: Negative for fever or weight change.  Respiratory: Negative for cough and shortness of breath.   Cardiovascular: Negative for chest pain or palpitations.  Gastrointestinal: Negative for abdominal pain, no bowel changes.  Musculoskeletal: Negative for gait problem or joint swelling.  Skin: Negative for rash.  Neurological: Negative for dizziness or headache.  No other specific complaints in a complete review of systems (except as listed in HPI above).   Objective  Vitals:   01/27/24 1304  BP: 110/72  Pulse: 98  Resp: 16  SpO2: 98%  Weight: 157 lb 8 oz (71.4 kg)  Height: 5' 4.5 (1.638 m)    Body mass index is 26.62 kg/m.  Physical Exam  Constitutional: Patient appears well-developed and well-nourished. No distress.  HENT: Head: Normocephalic and  atraumatic. Ears: B TMs ok, no erythema or effusion; Nose: Nose normal. Mouth/Throat: Oropharynx is clear and moist. No oropharyngeal exudate.  Eyes: Conjunctivae and EOM are normal. Pupils are equal, round, and reactive to light. No scleral icterus.  Neck: Normal range of motion. Neck supple. No JVD present. No thyromegaly present.  Cardiovascular: Normal rate, regular rhythm and normal heart sounds.  No murmur heard. No BLE edema. Pulmonary/Chest: Effort normal and breath sounds normal. No respiratory distress. Abdominal: Soft. Bowel sounds are normal, no distension. There is no tenderness. no masses Breast:bilateral lumps , no nipple discharge or rashes FEMALE GENITALIA:  Not done  RECTAL: not done  Musculoskeletal: Normal range of motion, no joint effusions. No gross deformities Neurological: he is alert and oriented to person, place, and time. No cranial nerve deficit. Coordination, balance, strength, speech and gait are normal.  Skin: Skin is warm and dry. No rash noted. No erythema.  Psychiatric: Patient has a normal mood and affect. behavior is normal. Judgment and thought content normal.      Assessment & Plan Adult Wellness Visit Routine wellness visit with positive hepatitis B surface antibody, normal kidney and liver function, no anemia, and improved lipid panel. Vitamin D  levels adequate. Weight loss of 21 pounds attributed to lifestyle changes. No current sexual activity since July 2024. No urinary incontinence. Regular physical activity and healthy dietary habits maintained. - Continue current lifestyle modifications including diet and exercise. - Encouraged continued use of Freestyle for glucose monitoring. - Encouraged continued use of condoms if sexually active. - Encouraged continued regular physical activity and healthy eating habits.  Prediabetes A1c levels improved from 6.8 to 6.2, indicating effective management through lifestyle changes. No current need for diabetes  medication. - Continue lifestyle modifications to maintain A1c levels below 6.5.  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 Follow-up Pap smear is due. - Placed referral for follow-up Pap smear with GYN.  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 No current issues with postmenopausal bleeding. Discussed importance of monitoring for any vaginal bleeding. - Monitor for any postmenopausal bleeding and report if it occurs.  Screening mammogram for breast cancer in patient with lumpy/dense breasts Due for routine screening mammogram. History of lumpy/dense breasts noted. - Ordered screening mammogram with 3D imaging due to dense breast tissue.  Screening for osteoporosis Postmenopausal status warrants osteoporosis screening. Insurance coverage for bone density scan needs verification. - Ordered bone density scan if covered by insurance.  Eczema or possible yogurt sensitivity Rash on arm associated with yogurt consumption, possibly indicating eczema or sensitivity. Rash resolved after discontinuing yogurt. - Avoid yogurt to prevent rash recurrence.        -USPSTF grade A and B recommendations reviewed with patient; age-appropriate recommendations, preventive care, screening tests, etc discussed and encouraged; healthy living encouraged; see AVS for patient education given to patient -Discussed importance of 150 minutes of physical activity weekly, eat two servings of fish weekly, eat one serving of tree nuts ( cashews, pistachios, pecans, almonds.SABRA) every other day, eat 6 servings of fruit/vegetables daily and drink plenty of water and avoid sweet beverages.   -Reviewed Health Maintenance: Yes.

## 2024-03-11 ENCOUNTER — Ambulatory Visit
Admission: RE | Admit: 2024-03-11 | Discharge: 2024-03-11 | Disposition: A | Discharge status: Home / Self Care | Source: Ambulatory Visit | Attending: Family Medicine | Admitting: Family Medicine

## 2024-03-11 DIAGNOSIS — Z1231 Encounter for screening mammogram for malignant neoplasm of breast: Secondary | ICD-10-CM | POA: Insufficient documentation

## 2024-05-17 ENCOUNTER — Encounter: Admitting: Certified Nurse Midwife

## 2024-05-23 ENCOUNTER — Ambulatory Visit: Admitting: Family Medicine

## 2025-01-30 ENCOUNTER — Encounter: Admitting: Family Medicine
